# Patient Record
Sex: Female | Born: 1990 | Race: Black or African American | Hispanic: No | Marital: Single | Smoking: Never smoker
Health system: Southern US, Community
[De-identification: ages and names within clinical notes are randomized; demographics above are authoritative.]

## PROBLEM LIST (undated history)

## (undated) DIAGNOSIS — J45909 Unspecified asthma, uncomplicated: Secondary | ICD-10-CM

## (undated) DIAGNOSIS — I10 Essential (primary) hypertension: Secondary | ICD-10-CM

---

## 2017-12-07 DIAGNOSIS — Z91040 Latex allergy status: Secondary | ICD-10-CM | POA: Diagnosis not present

## 2017-12-07 DIAGNOSIS — N760 Acute vaginitis: Secondary | ICD-10-CM | POA: Diagnosis not present

## 2017-12-07 DIAGNOSIS — B9689 Other specified bacterial agents as the cause of diseases classified elsewhere: Secondary | ICD-10-CM | POA: Diagnosis not present

## 2018-01-05 DIAGNOSIS — N760 Acute vaginitis: Secondary | ICD-10-CM | POA: Diagnosis not present

## 2018-01-05 DIAGNOSIS — B9689 Other specified bacterial agents as the cause of diseases classified elsewhere: Secondary | ICD-10-CM | POA: Diagnosis not present

## 2018-01-05 DIAGNOSIS — Z3202 Encounter for pregnancy test, result negative: Secondary | ICD-10-CM | POA: Diagnosis not present

## 2018-01-05 DIAGNOSIS — L739 Follicular disorder, unspecified: Secondary | ICD-10-CM | POA: Diagnosis not present

## 2018-02-01 DIAGNOSIS — F419 Anxiety disorder, unspecified: Secondary | ICD-10-CM | POA: Diagnosis not present

## 2018-02-01 DIAGNOSIS — R072 Precordial pain: Secondary | ICD-10-CM | POA: Diagnosis not present

## 2018-02-01 DIAGNOSIS — R109 Unspecified abdominal pain: Secondary | ICD-10-CM | POA: Diagnosis not present

## 2018-02-01 DIAGNOSIS — R05 Cough: Secondary | ICD-10-CM | POA: Diagnosis not present

## 2018-02-01 DIAGNOSIS — R062 Wheezing: Secondary | ICD-10-CM | POA: Diagnosis not present

## 2018-02-01 DIAGNOSIS — Z3202 Encounter for pregnancy test, result negative: Secondary | ICD-10-CM | POA: Diagnosis not present

## 2018-02-01 DIAGNOSIS — R42 Dizziness and giddiness: Secondary | ICD-10-CM | POA: Diagnosis not present

## 2018-02-01 DIAGNOSIS — R002 Palpitations: Secondary | ICD-10-CM | POA: Diagnosis not present

## 2018-02-01 DIAGNOSIS — R197 Diarrhea, unspecified: Secondary | ICD-10-CM | POA: Diagnosis not present

## 2018-02-01 DIAGNOSIS — R079 Chest pain, unspecified: Secondary | ICD-10-CM | POA: Diagnosis not present

## 2018-02-10 DIAGNOSIS — Y990 Civilian activity done for income or pay: Secondary | ICD-10-CM | POA: Diagnosis not present

## 2018-02-10 DIAGNOSIS — X58XXXA Exposure to other specified factors, initial encounter: Secondary | ICD-10-CM | POA: Diagnosis not present

## 2018-02-10 DIAGNOSIS — Z3202 Encounter for pregnancy test, result negative: Secondary | ICD-10-CM | POA: Diagnosis not present

## 2018-02-10 DIAGNOSIS — N3090 Cystitis, unspecified without hematuria: Secondary | ICD-10-CM | POA: Diagnosis not present

## 2018-02-10 DIAGNOSIS — S39012A Strain of muscle, fascia and tendon of lower back, initial encounter: Secondary | ICD-10-CM | POA: Diagnosis not present

## 2018-02-10 DIAGNOSIS — Y9259 Other trade areas as the place of occurrence of the external cause: Secondary | ICD-10-CM | POA: Diagnosis not present

## 2018-02-10 DIAGNOSIS — M545 Low back pain: Secondary | ICD-10-CM | POA: Diagnosis not present

## 2018-02-24 DIAGNOSIS — Z30430 Encounter for insertion of intrauterine contraceptive device: Secondary | ICD-10-CM | POA: Diagnosis not present

## 2018-03-10 DIAGNOSIS — Z30431 Encounter for routine checking of intrauterine contraceptive device: Secondary | ICD-10-CM | POA: Diagnosis not present

## 2018-04-15 DIAGNOSIS — Z87891 Personal history of nicotine dependence: Secondary | ICD-10-CM | POA: Diagnosis not present

## 2018-04-15 DIAGNOSIS — J069 Acute upper respiratory infection, unspecified: Secondary | ICD-10-CM | POA: Diagnosis not present

## 2018-09-11 DIAGNOSIS — S39012A Strain of muscle, fascia and tendon of lower back, initial encounter: Secondary | ICD-10-CM | POA: Diagnosis not present

## 2018-09-11 DIAGNOSIS — M9903 Segmental and somatic dysfunction of lumbar region: Secondary | ICD-10-CM | POA: Diagnosis not present

## 2018-09-11 DIAGNOSIS — M5414 Radiculopathy, thoracic region: Secondary | ICD-10-CM | POA: Diagnosis not present

## 2018-09-11 DIAGNOSIS — M9902 Segmental and somatic dysfunction of thoracic region: Secondary | ICD-10-CM | POA: Diagnosis not present

## 2018-09-11 DIAGNOSIS — M5386 Other specified dorsopathies, lumbar region: Secondary | ICD-10-CM | POA: Diagnosis not present

## 2018-09-11 DIAGNOSIS — M9905 Segmental and somatic dysfunction of pelvic region: Secondary | ICD-10-CM | POA: Diagnosis not present

## 2018-11-03 DIAGNOSIS — Z1159 Encounter for screening for other viral diseases: Secondary | ICD-10-CM | POA: Diagnosis not present

## 2019-01-20 DIAGNOSIS — Z711 Person with feared health complaint in whom no diagnosis is made: Secondary | ICD-10-CM | POA: Diagnosis not present

## 2019-01-20 DIAGNOSIS — N898 Other specified noninflammatory disorders of vagina: Secondary | ICD-10-CM | POA: Diagnosis not present

## 2019-01-20 DIAGNOSIS — Z30431 Encounter for routine checking of intrauterine contraceptive device: Secondary | ICD-10-CM | POA: Diagnosis not present

## 2019-04-13 ENCOUNTER — Other Ambulatory Visit: Payer: Self-pay

## 2019-04-13 DIAGNOSIS — Z20828 Contact with and (suspected) exposure to other viral communicable diseases: Secondary | ICD-10-CM | POA: Diagnosis not present

## 2019-04-13 DIAGNOSIS — Z20822 Contact with and (suspected) exposure to covid-19: Secondary | ICD-10-CM

## 2019-04-14 ENCOUNTER — Telehealth: Payer: Self-pay | Admitting: *Deleted

## 2019-04-14 LAB — NOVEL CORONAVIRUS, NAA: SARS-CoV-2, NAA: NOT DETECTED

## 2019-04-14 NOTE — Telephone Encounter (Signed)
Patient assisted with my chart .  

## 2019-06-09 ENCOUNTER — Ambulatory Visit: Payer: Medicaid Other | Attending: Internal Medicine

## 2019-06-09 DIAGNOSIS — Z20822 Contact with and (suspected) exposure to covid-19: Secondary | ICD-10-CM

## 2019-06-10 LAB — NOVEL CORONAVIRUS, NAA: SARS-CoV-2, NAA: NOT DETECTED

## 2019-06-15 DIAGNOSIS — N760 Acute vaginitis: Secondary | ICD-10-CM | POA: Diagnosis not present

## 2019-06-15 DIAGNOSIS — Z113 Encounter for screening for infections with a predominantly sexual mode of transmission: Secondary | ICD-10-CM | POA: Diagnosis not present

## 2019-08-03 DIAGNOSIS — Z30432 Encounter for removal of intrauterine contraceptive device: Secondary | ICD-10-CM | POA: Diagnosis not present

## 2019-08-03 DIAGNOSIS — N760 Acute vaginitis: Secondary | ICD-10-CM | POA: Diagnosis not present

## 2019-08-05 ENCOUNTER — Encounter (HOSPITAL_COMMUNITY): Payer: Self-pay | Admitting: Emergency Medicine

## 2019-08-05 ENCOUNTER — Other Ambulatory Visit: Payer: Self-pay

## 2019-08-05 ENCOUNTER — Emergency Department (HOSPITAL_COMMUNITY)
Admission: EM | Admit: 2019-08-05 | Discharge: 2019-08-06 | Disposition: A | Discharge status: Home / Self Care | Payer: Medicaid Other | Attending: Emergency Medicine | Admitting: Emergency Medicine

## 2019-08-05 DIAGNOSIS — Z9889 Other specified postprocedural states: Secondary | ICD-10-CM | POA: Diagnosis not present

## 2019-08-05 DIAGNOSIS — R1030 Lower abdominal pain, unspecified: Secondary | ICD-10-CM | POA: Diagnosis not present

## 2019-08-05 DIAGNOSIS — N939 Abnormal uterine and vaginal bleeding, unspecified: Secondary | ICD-10-CM | POA: Diagnosis not present

## 2019-08-05 DIAGNOSIS — N938 Other specified abnormal uterine and vaginal bleeding: Secondary | ICD-10-CM | POA: Insufficient documentation

## 2019-08-05 DIAGNOSIS — R42 Dizziness and giddiness: Secondary | ICD-10-CM | POA: Insufficient documentation

## 2019-08-05 HISTORY — DX: Unspecified asthma, uncomplicated: J45.909

## 2019-08-05 LAB — CBC
HCT: 38.9 % (ref 36.0–46.0)
Hemoglobin: 12.7 g/dL (ref 12.0–15.0)
MCH: 30.1 pg (ref 26.0–34.0)
MCHC: 32.6 g/dL (ref 30.0–36.0)
MCV: 92.2 fL (ref 80.0–100.0)
Platelets: 323 10*3/uL (ref 150–400)
RBC: 4.22 MIL/uL (ref 3.87–5.11)
RDW: 12.6 % (ref 11.5–15.5)
WBC: 12.6 10*3/uL — ABNORMAL HIGH (ref 4.0–10.5)
nRBC: 0 % (ref 0.0–0.2)

## 2019-08-05 MED ORDER — KETOROLAC TROMETHAMINE 30 MG/ML IJ SOLN
30.0000 mg | Freq: Once | INTRAMUSCULAR | Status: DC
  Filled 2019-08-05: qty 1

## 2019-08-05 MED ORDER — METRONIDAZOLE 500 MG PO TABS
500.0000 mg | ORAL_TABLET | Freq: Once | ORAL | Status: AC
  Administered 2019-08-05: 500 mg via ORAL
  Filled 2019-08-05: qty 1

## 2019-08-05 MED ORDER — SODIUM CHLORIDE 0.9 % IV BOLUS (SEPSIS)
1000.0000 mL | Freq: Once | INTRAVENOUS | Status: AC
  Administered 2019-08-05: 1000 mL via INTRAVENOUS

## 2019-08-05 NOTE — ED Provider Notes (Signed)
TIME SEEN: 11:25 PM  CHIEF COMPLAINT: Vaginal bleeding  HPI: Patient is a 29 year old female who presents to the emergency department with complaints of lower abdominal cramping and vaginal bleeding that started after having her IUD.  States Connie Horton [facility].  [facility]UD removed Wednesday [facility]02/[facility] [place].  Reports she had it removed because she just wanted to be off of hormonal therapy for some time.  Started cramping and having heavy bleeding today.  States she was passing clots which made her concerned.  Sexually active with one female partner.  Uses condoms.  Declines STD testing.  On treatment for BV now.  Last STD screen in Feb. denies vaginal discharge.  LMP - 3/19 - 3/23  Having irregular bleeding with IUD.  Having heavy periods with bad cramps prior to IUD.  States she has been on birth control since she was 29 years old due to very heavy periods with pain.  [facility].  Felt lightheaded and weak.  No fever, N/V/D.  No dysuria.  Feels she is not able to empty her bladder fully.  ROS: See HPI Constitutional: no fever  Eyes: no drainage  ENT: no runny nose   Cardiovascular:  no chest pain  Resp: no SOB  GI: no vomiting GU: no dysuria Integumentary: no rash  Allergy: no hives  Musculoskeletal: no leg swelling  Neurological: no slurred speech ROS otherwise negative  PAST MEDICAL HISTORY/PAST SURGICAL HISTORY:  Past Medical History:  Diagnosis Date  . Asthma     MEDICATIONS:  Prior to Admission medications   Not on File    ALLERGIES:  Allergies  Allergen Reactions  . Citrus Anaphylaxis  . Latex Anaphylaxis    SOCIAL HISTORY:  Social History   Tobacco Use  . Smoking status: Never Smoker  . Smokeless tobacco: Never Used  Substance Use Topics  . Alcohol use: Yes    Comment: occassional    FAMILY HISTORY: No family history  on file.  EXAM: BP (!) 134/91 (BP Location: Left Arm)   Pulse 77   Temp 98.2 F (36.8 C) (Oral)   Resp (!) 22   SpO2 98%  CONSTITUTIONAL: Alert and oriented and responds appropriately to questions. Well-appearing; well-nourished HEAD: Normocephalic EYES: Conjunctivae clear, pupils appear equal, EOM appear intact, no conjunctival pallor ENT: normal nose; moist mucous membranes NECK: Supple, normal ROM CARD: RRR; S1 and S2 appreciated; no murmurs, no clicks, no rubs, no gallops RESP: Normal chest excursion without splinting or tachypnea; breath sounds clear and equal bilaterally; no wheezes, no rhonchi, no rales, no hypoxia or respiratory distress, speaking full sentences ABD/GI: Normal bowel sounds; non-distended; soft, non-tender, no rebound, no guarding, no peritoneal signs, no hepatosplenomegaly GU:  Normal external genitalia. No lesions, rashes noted. Patient has small amount of dark red vaginal bleeding on exam coming from the cervical os.  No sign of hemorrhage.  No clots.  No appreciable vaginal discharge.  No adnexal tenderness, mass or fullness, no cervical motion tenderness. Cervix is not appear friable.  Cervix is closed.  Chaperone present for exam. BACK:  The back appears normal EXT: Normal ROM in all joints; no deformity noted, no edema; no cyanosis SKIN: Normal color for age and race; warm; no rash on exposed skin NEURO: Moves all extremities equally PSYCH: The patient's mood and manner are appropriate.   MEDICAL DECISION MAKING: Patient here with vaginal bleeding after having her  IUD removed.  Discussed with patient that this is to be expected after withdrawal of hormone therapy.  Will check CBC today to evaluate for anemia given reports of heavy bleeding with clots.  She is not on blood thinners.  Will check pregnancy test.  Also reports some urinary urgency and feeling she is unable to empty her bladder fully.  Will check urinalysis.  Hemodynamically stable currently.   Abdominal exam is benign.  Doubt ectopic, TOA, PID, torsion, appendicitis.  She reports significant cramping and lightheadedness.  Will treat with IV fluids and Toradol.  She declines STD screening at this time.  She is currently on Flagyl for BV and is requesting her nighttime dose.  ED PROGRESS: Pelvic exam reassuring.  No sign of hemorrhage at this time.  Patient reports feeling better.  Hemoglobin currently 12.7.  Normal platelet count.  Pregnancy test negative.  Urine shows small amount of blood and trace leukocytes.  I suspect this is from her vaginal bleeding.  No other sign of infection.  Patient continues to be hemodynamically stable.  I have recommended that she [facility]  I do not feel she needs any emergent intervention for her vaginal bleeding at this time.  It was extremely mild currently.  Recommended Tylenol and Motrin for pain.  Discussed return precautions.  At this time, I do not feel there is any life-threatening condition present. I have reviewed, interpreted and discussed all results (EKG, imaging, lab, urine as appropriate) and exam findings with patient/family. I have reviewed nursing notes and appropriate previous records.  I feel the patient is safe to be discharged home without further emergent workup and can continue workup as an outpatient as needed. Discussed usual and customary return precautions. Patient/family verbalize understanding and are comfortable with this plan.  Outpatient follow-up has been provided as needed. All questions have been answered.   Connie Horton was evaluated in Emergency Department on 08/05/2019 for the symptoms described in the history of present illness. She was evaluated in the context of the global COVID-19 pandemic, which necessitated consideration that the patient might be at risk for infection with the SARS-CoV-2 virus that causes COVID-19. Institutional protocols and algorithms that pertain  to the evaluation of patients at risk for COVID-19 are in a state of rapid change based on information released by regulatory bodies including the CDC and federal and state organizations. These policies and algorithms were followed during the patient's care in the ED.     Sonika Levins, Layla Maw, DO 08/06/19 [id number]

## 2019-08-05 NOTE — ED Notes (Signed)
Pt lying in bed. Full monitor on. Pt denies any needs. NAD noted.

## 2019-08-05 NOTE — ED Triage Notes (Signed)
Patient presents after having her IUD removed Wednesday and woke up today with heavy vaginal bleeding and intermittent cramping. Patient endorses nausea and light-headedness. Patient states she is also passing large clots. Patient states she has had to change her pad twice in the last hour. Patient states the bleeding is constant.

## 2019-08-06 LAB — URINALYSIS, ROUTINE W REFLEX MICROSCOPIC
Bacteria, UA: NONE SEEN
Bilirubin Urine: NEGATIVE
Glucose, UA: NEGATIVE mg/dL
Ketones, ur: NEGATIVE mg/dL
Nitrite: NEGATIVE
Protein, ur: 30 mg/dL — AB
Specific Gravity, Urine: 1.027 (ref 1.005–1.030)
pH: 7 (ref 5.0–8.0)

## 2019-08-06 LAB — I-STAT BETA HCG BLOOD, ED (MC, WL, AP ONLY): I-stat hCG, quantitative: <5 m[IU]/mL (ref <5)

## 2019-08-06 NOTE — ED Notes (Signed)
Pt ambulatory to bathroom with no assistance needed

## 2019-08-06 NOTE — ED Notes (Signed)
Pt sitting up on the side of the bed. NAD noted. Pt denies any needs and is ready for d/c

## 2019-08-06 NOTE — Discharge Instructions (Addendum)
Your hemoglobin today was 12.7.  Your pregnancy test was negative.  Your urine showed no sign of infection.  [facility].  Your bleeding on exam has currently decreased and it is normal to start bleeding after the removal of an IUD.  Given your history of very heavy and painful periods in the past, you may want to discuss with your OB/GYN going back on hormone therapy/birth control to help control this.  You may alternate Tylenol 1000 mg every 6 hours as needed for pain and ibuprofen 800 mg every 8 hours as needed for pain.  [facility]:  [facility] [employer] [facility] [employer] [address] [place], [place] [phone]  [facility] for [facility] [address] [place], [place] [phone]  [facility] [address] [place]  [address] [place], [place] [phone]   [facility] [address] [place] [address] [place], [place] [phone]  [facility] [address] [place], [place] [phone]   [facility] [address] [place] [address] [place], [place] [phone]   [facility] [address] [place], [place] [phone]   [facility] [address] [place], [place] [phone]   [facility] [address] [place], [place] [phone]

## 2019-08-15 ENCOUNTER — Ambulatory Visit: Payer: Medicaid Other | Attending: Internal Medicine

## 2019-08-15 DIAGNOSIS — Z20822 Contact with and (suspected) exposure to covid-19: Secondary | ICD-10-CM | POA: Diagnosis not present

## 2019-08-16 LAB — SARS-COV-2, NAA 2 DAY TAT

## 2019-08-16 LAB — NOVEL CORONAVIRUS, NAA: SARS-CoV-2, NAA: NOT DETECTED

## 2019-08-19 ENCOUNTER — Ambulatory Visit (HOSPITAL_COMMUNITY)
Admission: EM | Admit: 2019-08-19 | Discharge: 2019-08-19 | Disposition: A | Discharge status: Home / Self Care | Payer: Medicaid Other | Attending: Family Medicine | Admitting: Family Medicine

## 2019-08-19 ENCOUNTER — Other Ambulatory Visit: Payer: Self-pay

## 2019-08-19 ENCOUNTER — Encounter (HOSPITAL_COMMUNITY): Payer: Self-pay

## 2019-08-19 DIAGNOSIS — Z20822 Contact with and (suspected) exposure to covid-19: Secondary | ICD-10-CM | POA: Diagnosis not present

## 2019-08-19 DIAGNOSIS — R197 Diarrhea, unspecified: Secondary | ICD-10-CM

## 2019-08-19 LAB — POCT RAPID STREP A: Streptococcus, Group A Screen (Direct): NEGATIVE

## 2019-08-19 NOTE — Discharge Instructions (Addendum)
Go home to rest Drink plenty of fluids Take Tylenol for pain or fever/  Read diarrhea instructions. You may take over-the-counter cough and cold medicines as needed You must quarantine at home until your test result is available You can check for your test result in MyChart CALL for questions

## 2019-08-19 NOTE — ED Triage Notes (Signed)
Pt states she was tested for Covid last Saturday. Pt states the results read negative.  Pt states she has diarrhea. And her job wants her retested. Pt states she needs a work note .

## 2019-08-19 NOTE — ED Provider Notes (Signed)
Connie Horton    CSN: [id number] Arrival date & time: 08/19/19  1253      History   Chief Complaint Chief Complaint  Patient presents with  . Diarrhea    HPI Connie Horton is a 29 y.o. female.   HPI  Patient was tested for Covid a week ago.  It was negative She states that she continues to feel bad.  Fatigue.  Body aches.  Now she has developed diarrhea. Since her last Covid test she has been exposed to Covid.  She states that she took her car to her mechanic who was coughing and was sick.  She later found out that he had Covid. She needs a note for work No shortness of breath.  No fever or chills.  No loss of taste or smell.   Past Medical History:  Diagnosis Date  . Asthma     There are no problems to display for this patient.   History reviewed. No pertinent surgical history.  OB History   No obstetric history on file.      Home Medications    Prior to Admission medications   Medication Sig Start Date End Date Taking? Authorizing Provider  albuterol (VENTOLIN HFA) 108 (90 Base) MCG/ACT inhaler Inhale 2 puffs into the lungs every 6 (six) hours as needed for wheezing or shortness of breath.    [provider]  diphenhydrAMINE (BENADRYL) 25 MG tablet Take 25 mg by mouth every 6 (six) hours as needed for itching or allergies.    [provider]  ibuprofen (ADVIL) 200 MG tablet Take 400 mg by mouth every 6 (six) hours as needed for moderate pain.    [provider]  metroNIDAZOLE (FLAGYL) 500 MG tablet Take 500 mg by mouth 2 (two) times daily.  08/03/19   [provider]  sodium chloride (OCEAN) 0.65 % SOLN nasal spray Place 1 spray into both nostrils as needed for congestion.    [provider]    Family History History reviewed. No pertinent family history.  Social History Social History   Tobacco Use  . Smoking status: Never Smoker  . Smokeless tobacco: Never Used  Substance Use Topics  . Alcohol  use: Yes    Comment: occassional  . Drug use: Not Currently     Allergies   Citrus and Latex   Review of Systems Review of Systems  Constitutional: Positive for fatigue.  HENT: Positive for sore throat.   Gastrointestinal: Positive for diarrhea.     Physical Exam Triage Vital Signs ED Triage Vitals  Enc Vitals Group     BP 08/19/19 1342 137/90     Pulse Rate 08/19/19 1342 89     Resp 08/19/19 1342 16     Temp 08/19/19 1342 98.2 F (36.8 C)     Temp Source 08/19/19 1342 Oral     SpO2 08/19/19 1342 100 %     Weight 08/19/19 1344 172 lb 9.6 oz (78.3 kg)     Height --      Head Circumference --      Peak Flow --      Pain Score 08/19/19 1344 5     Pain Loc --      Pain Edu? --      Excl. in [place]? --    No data found.  Updated Vital Signs BP 137/90 (BP Location: Right Arm)   Pulse 89   Temp 98.2 F (36.8 C) (Oral)   Resp 16  Wt 78.3 kg   LMP 08/19/2019   SpO2 100%      Physical Exam Constitutional:      General: She is not in acute distress.    Appearance: She is well-developed.  HENT:     Head: Normocephalic and atraumatic.     Right Ear: Tympanic membrane and ear canal normal.     Left Ear: Tympanic membrane and ear canal normal.     Nose: Nose normal. No congestion.     Mouth/Throat:     Pharynx: Posterior oropharyngeal erythema present.     Comments: Posterior pharynx injected.  No exudate.  Rapid strep is negative Eyes:     Conjunctiva/sclera: Conjunctivae normal.     Pupils: Pupils are equal, round, and reactive to light.  Cardiovascular:     Rate and Rhythm: Normal rate and regular rhythm.     Heart sounds: Normal heart sounds.  Pulmonary:     Effort: Pulmonary effort is normal. No respiratory distress.     Breath sounds: Normal breath sounds.  Musculoskeletal:        General: Normal range of motion.     Cervical back: Normal range of motion.  Lymphadenopathy:     Cervical: Cervical adenopathy present.  Skin:    General: Skin is warm and  dry.  Neurological:     Mental Status: She is alert.  Psychiatric:        Mood and Affect: Mood normal.        Behavior: Behavior normal.      UC Treatments / Results  Labs (all labs ordered are listed, but only abnormal results are displayed) Labs Reviewed  CULTURE, GROUP A STREP [facility])  POCT RAPID STREP A  Rapid strep is negative Throat culture is pending  EKG   Radiology No results found.  Procedures Procedures (including critical care time)  Medications Ordered in UC Medications - No data to display  Initial Impression / Assessment and Plan / UC Course  I have reviewed the triage vital signs and the nursing notes.  Pertinent labs & imaging results that were available during my care of the patient were reviewed by me and considered in my medical decision making (see chart for details).     Note for work.  Repeat coronavirus testing.  Emphasized the need to quarantine until test results are available Final Clinical Impressions(s) / UC Diagnoses   Final diagnoses:  Diarrhea, unspecified type  Contact with and (suspected) exposure to covid-19     Discharge Instructions     Go home to rest Drink plenty of fluids Take Tylenol for pain or fever/  Read diarrhea instructions. You may take over-the-counter cough and cold medicines as needed You must quarantine at home until your test result is available You can check for your test result in MyChart CALL for questions   ED Prescriptions    None     PDMP not reviewed this encounter.   Eustace Moore, MD 08/19/19 2110

## 2019-08-20 ENCOUNTER — Telehealth (HOSPITAL_COMMUNITY): Payer: Self-pay

## 2019-08-20 LAB — SARS CORONAVIRUS 2 (TAT 6-24 HRS): SARS Coronavirus 2: NEGATIVE

## 2019-08-21 LAB — CULTURE, GROUP A STREP (THRC)

## 2019-08-26 ENCOUNTER — Telehealth (HOSPITAL_COMMUNITY): Payer: Self-pay

## 2019-08-26 NOTE — Telephone Encounter (Signed)
Patient calling requesting a work note. Needs work note to say she was exposed to COVID. Reports she is still experiencing sore throat, headache, and diarrhea. Her job is making her quarantine until she is no longer symptomatic.   Informed patient that since she was seen almost a week ago, we would really need her to come in an be re-evaluated. Also educated her about increased risk for false negatives in the first five days of contracting COVID. Patient agreeable to come back in and be re-evaluated by a medical professional. Patient reports she will come either today or tomorrow.

## 2019-08-29 ENCOUNTER — Ambulatory Visit (HOSPITAL_COMMUNITY)
Admission: EM | Admit: 2019-08-29 | Discharge: 2019-08-29 | Disposition: A | Discharge status: Home / Self Care | Payer: Medicaid Other | Attending: Family Medicine | Admitting: Family Medicine

## 2019-08-29 ENCOUNTER — Other Ambulatory Visit: Payer: Self-pay

## 2019-08-29 DIAGNOSIS — B349 Viral infection, unspecified: Secondary | ICD-10-CM | POA: Diagnosis not present

## 2019-08-29 DIAGNOSIS — J029 Acute pharyngitis, unspecified: Secondary | ICD-10-CM

## 2019-08-29 DIAGNOSIS — J45909 Unspecified asthma, uncomplicated: Secondary | ICD-10-CM | POA: Insufficient documentation

## 2019-08-29 DIAGNOSIS — R5383 Other fatigue: Secondary | ICD-10-CM

## 2019-08-29 DIAGNOSIS — Z91040 Latex allergy status: Secondary | ICD-10-CM | POA: Insufficient documentation

## 2019-08-29 DIAGNOSIS — Z20822 Contact with and (suspected) exposure to covid-19: Secondary | ICD-10-CM | POA: Diagnosis not present

## 2019-08-29 DIAGNOSIS — R52 Pain, unspecified: Secondary | ICD-10-CM | POA: Diagnosis present

## 2019-08-29 LAB — POCT RAPID STREP A: Streptococcus, Group A Screen (Direct): NEGATIVE

## 2019-08-29 MED ORDER — IBUPROFEN 800 MG PO TABS: 800.0000 mg | ORAL_TABLET | Freq: Three times a day (TID) | ORAL | 0 refills | Status: DC

## 2019-08-29 MED ORDER — FLUTICASONE PROPIONATE 50 MCG/ACT NA SUSP: 1.0000 | Freq: Every day | NASAL | 0 refills | Status: DC

## 2019-08-29 MED ORDER — CETIRIZINE HCL 10 MG PO CAPS: 10.0000 mg | ORAL_CAPSULE | Freq: Every day | ORAL | 0 refills | Status: DC

## 2019-08-29 NOTE — ED Triage Notes (Signed)
Pt tested negative for COVID 4/16. C/o continued body aches, fatigue, headache an diarrhea.

## 2019-08-29 NOTE — Discharge Instructions (Signed)
Strep test negative, repeat Covid test pending Tylenol and ibuprofen for body aches, headaches, sore throat Begin daily Flonase nasal spray to help with sinus congestion/inflammation Begin daily cetirizine to help with any postnasal drainage contributing to sore throat Rest and drink plenty of fluids  Please follow-up if any symptoms not improving or worsening

## 2019-08-29 NOTE — ED Provider Notes (Signed)
[facility]    CSN: [id number] Arrival date & time: 08/29/19  1321      History   Chief Complaint Chief Complaint  Patient presents with  . Generalized Body Aches    HPI Connie Horton is a 29 y.o. female history of asthma presenting today for evaluation of fatigue, sore throat and headache.  Patient notes that over the past couple weeks she has had intermittent symptoms off and on.  She has had persistent fatigue.  She initially had diarrhea, but this is improved, bowels have returned to normal.  She continues to have a sore throat which worsens at nighttime and in the morning.  She reports feeling congested and having difficulty breathing at nighttime, but no significant rhinorrhea.  Has had multiple Covid exposures of recently, has been seen here previously with negative Covid testing.  Reports that her boyfriend who she was around this weekend also recently tested positive for Covid.  Her symptoms started before, have not changed since.  HPI  Past Medical History:  Diagnosis Date  . Asthma     There are no problems to display for this patient.   No past surgical history on file.  OB History   No obstetric history on file.      Home Medications    Prior to Admission medications   Medication Sig Start Date End Date Taking? Authorizing Provider  albuterol (VENTOLIN HFA) 108 (90 Base) MCG/ACT inhaler Inhale 2 puffs into the lungs every 6 (six) hours as needed for wheezing or shortness of breath.    [provider]  Cetirizine HCl 10 MG CAPS Take 1 capsule (10 mg total) by mouth daily. 08/29/19   Devyn Griffing C, PA-C  diphenhydrAMINE (BENADRYL) 25 MG tablet Take 25 mg by mouth every 6 (six) hours as needed for itching or allergies.    [provider]  fluticasone (FLONASE) 50 MCG/ACT nasal spray Place 1-2 sprays into both nostrils daily for 7 days. 08/29/19 09/05/19  Marshal Eskew C, PA-C  ibuprofen (ADVIL) 800 MG tablet Take 1 tablet (800 mg  total) by mouth 3 (three) times daily. 08/29/19   Latanga Nedrow C, PA-C  metroNIDAZOLE (FLAGYL) 500 MG tablet Take 500 mg by mouth 2 (two) times daily.  08/03/19   [provider]  sodium chloride (OCEAN) 0.65 % SOLN nasal spray Place 1 spray into both nostrils as needed for congestion.    [provider]    Family History No family history on file.  Social History Social History   Tobacco Use  . Smoking status: Never Smoker  . Smokeless tobacco: Never Used  Substance Use Topics  . Alcohol use: Yes    Comment: occassional  . Drug use: Not Currently     Allergies   Citrus and Latex   Review of Systems Review of Systems  Constitutional: Positive for appetite change and fatigue. Negative for activity change, chills and fever.  HENT: Positive for congestion and sore throat. Negative for ear pain, rhinorrhea, sinus pressure and trouble swallowing.   Eyes: Negative for discharge and redness.  Respiratory: Negative for cough, chest tightness and shortness of breath.   Cardiovascular: Negative for chest pain.  Gastrointestinal: Negative for abdominal pain, diarrhea, nausea and vomiting.  Musculoskeletal: Positive for myalgias.  Skin: Negative for rash.  Neurological: Positive for headaches. Negative for dizziness and light-headedness.     Physical Exam Triage Vital Signs ED Triage Vitals  Enc Vitals Group     BP 08/29/19 1356 [facility])  133/95     Pulse Rate 08/29/19 1356 96     Resp 08/29/19 1356 16     Temp 08/29/19 1356 98.6 F (37 C)     Temp Source 08/29/19 1356 Oral     SpO2 08/29/19 1356 100 %     Weight --      Height --      Head Circumference --      Peak Flow --      Pain Score 08/29/19 1509 0     Pain Loc --      Pain Edu? --      Excl. in [place]? --    No data found.  Updated Vital Signs BP (!) 133/95   Pulse 96   Temp 98.6 F (37 C) (Oral)   Resp 16   LMP 08/19/2019   SpO2 100%   Visual Acuity Right Eye Distance:   Left Eye  Distance:   Bilateral Distance:    Right Eye Near:   Left Eye Near:    Bilateral Near:     Physical Exam Vitals and nursing note reviewed.  Constitutional:      General: She is not in acute distress.    Appearance: She is well-developed.  HENT:     Head: Normocephalic and atraumatic.     Ears:     Comments: Bilateral ears without tenderness to palpation of external auricle, tragus and mastoid, EAC's without erythema or swelling, TM's with good bony landmarks and cone of light. Non erythematous.     Mouth/Throat:     Comments: Oral mucosa pink and moist, no tonsillar enlargement or exudate. Posterior pharynx patent and nonerythematous, no uvula deviation or swelling. Normal phonation.  Eyes:     Conjunctiva/sclera: Conjunctivae normal.  Cardiovascular:     Rate and Rhythm: Normal rate and regular rhythm.     Heart sounds: No murmur.  Pulmonary:     Effort: Pulmonary effort is normal. No respiratory distress.     Breath sounds: Normal breath sounds.     Comments: Breathing comfortably at rest, CTABL, no wheezing, rales or other adventitious sounds auscultated Abdominal:     Palpations: Abdomen is soft.     Tenderness: There is no abdominal tenderness.     Comments: Soft, nondistended, nontender to light and deep palpation throughout abdomen  Musculoskeletal:     Cervical back: Neck supple.  Skin:    General: Skin is warm and dry.  Neurological:     Mental Status: She is alert.      UC Treatments / Results  Labs (all labs ordered are listed, but only abnormal results are displayed) Labs Reviewed  SARS CORONAVIRUS 2 (TAT 6-24 HRS)  CULTURE, GROUP A STREP [facility])  POCT RAPID STREP A    EKG   Radiology No results found.  Procedures Procedures (including critical care time)  Medications Ordered in UC Medications - No data to display  Initial Impression / Assessment and Plan / UC Course  I have reviewed the triage vital signs and the nursing notes.  Pertinent  labs & imaging results that were available during my care of the patient were reviewed by me and considered in my medical decision making (see chart for details).     Repeat strep negative, Covid PCR pending given new exposure.  Possible false negative.  Recommending continued symptomatic and supportive care.  No sign of specific infection on exam, lungs clear.  Rest and push fluids.  Provided work note for extended quarantine.  Discussed strict return precautions. Patient verbalized understanding and is agreeable with plan.  Final Clinical Impressions(s) / UC Diagnoses   Final diagnoses:  Viral illness  Fatigue, unspecified type  Sore throat     Discharge Instructions     Strep test negative, repeat Covid test pending Tylenol and ibuprofen for body aches, headaches, sore throat Begin daily Flonase nasal spray to help with sinus congestion/inflammation Begin daily cetirizine to help with any postnasal drainage contributing to sore throat Rest and drink plenty of fluids  Please follow-up if any symptoms not improving or worsening   ED Prescriptions    Medication Sig Dispense Auth. Provider   ibuprofen (ADVIL) 800 MG tablet Take 1 tablet (800 mg total) by mouth 3 (three) times daily. 21 tablet Owynn Mosqueda C, PA-C   fluticasone (FLONASE) 50 MCG/ACT nasal spray Place 1-2 sprays into both nostrils daily for 7 days. 1 g Shimika Ames C, PA-C   Cetirizine HCl 10 MG CAPS Take 1 capsule (10 mg total) by mouth daily. 15 capsule Coren Sagan, [place] C, PA-C     PDMP not reviewed this encounter.   Lew Dawes, [place] 08/29/19 1607

## 2019-08-30 LAB — SARS CORONAVIRUS 2 (TAT 6-24 HRS): SARS Coronavirus 2: NEGATIVE

## 2019-08-31 LAB — CULTURE, GROUP A STREP (THRC)

## 2019-10-10 ENCOUNTER — Ambulatory Visit: Payer: Medicaid Other | Admitting: Family Medicine

## 2020-02-14 ENCOUNTER — Emergency Department (HOSPITAL_COMMUNITY): Payer: Medicaid Other

## 2020-02-14 ENCOUNTER — Encounter (HOSPITAL_COMMUNITY): Payer: Self-pay

## 2020-02-14 ENCOUNTER — Emergency Department (HOSPITAL_COMMUNITY)
Admission: EM | Admit: 2020-02-14 | Discharge: 2020-02-14 | Disposition: A | Discharge status: Home / Self Care | Payer: Medicaid Other | Attending: Emergency Medicine | Admitting: Emergency Medicine

## 2020-02-14 DIAGNOSIS — M25511 Pain in right shoulder: Secondary | ICD-10-CM | POA: Insufficient documentation

## 2020-02-14 DIAGNOSIS — M542 Cervicalgia: Secondary | ICD-10-CM | POA: Diagnosis not present

## 2020-02-14 DIAGNOSIS — J45909 Unspecified asthma, uncomplicated: Secondary | ICD-10-CM | POA: Insufficient documentation

## 2020-02-14 DIAGNOSIS — Z79899 Other long term (current) drug therapy: Secondary | ICD-10-CM | POA: Diagnosis not present
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
## 2020-02-14 NOTE — ED Notes (Signed)
An After Visit Summary was printed and given to the patient. Discharge instructions given and no further questions at this time.  

## 2020-02-14 NOTE — Discharge Instructions (Addendum)
Seen for a shoulder injury.  Imaging looks reassuring.  I placed you in a sling,  please use for 1 week.  Do not sleep with it on.  Use only during vigorous activities.  I recommend taking over-the-counter pain medications like ibuprofen and or Tylenol every 6 hours as needed please follow dosing the back of bottle.  I also recommend applying heat to the area and stretching the muscles as this will help decrease stiffness and pain.  It is important that you follow-up with Ortho for further evaluation management.  I have given you contact information for Dr. Ophelia Charter please call today  Come back to emergency department if you develop pins and needle sensation in your arm, chest pain, shortness of breath, severe abdominal pain, uncontrolled nausea, vomiting and diarrhea

## 2020-02-14 NOTE — ED Provider Notes (Signed)
[facility]-EMERGENCY DEPT Provider Note   CSN: [id number] Arrival date & time: 02/14/20  0962     History Chief Complaint  Patient presents with  . Shoulder Pain    Connie Horton is a 29 y.o. female.  HPI   Patient with no significant medical history presents to the emergency department with chief complaint of right shoulder pain.  Patient states she hurt her shoulder 2 days ago when she was "play wrestling" with her boyfriend.  She states she fell off the bed landing directly on her right shoulder.  She states since then she has had increasing shoulder and right neck pain.  She claims the pain is a constant throbbing sensation which is worse  when she moves her shoulder.  She denies numbness or tingling in her fingers or wrist and has been taking ibuprofen with some relief.  She has not seen in orthopedics for this injury.  She denies hitting her head, losing conscious, being on anticoagulant.  She states her home is safe.  Patient denies headache, fever, chills, shortness of breath, chest pain, dumping, nausea, vomiting, diarrhea, pedal edema.  Past Medical History:  Diagnosis Date  . Asthma     There are no problems to display for this patient.   History reviewed. No pertinent surgical history.   OB History   No obstetric history on file.     History reviewed. No pertinent family history.  Social History   Tobacco Use  . Smoking status: Never Smoker  . Smokeless tobacco: Never Used  Vaping Use  . Vaping Use: Every day  Substance Use Topics  . Alcohol use: Yes    Comment: occassional  . Drug use: Not Currently    Home Medications Prior to Admission medications   Medication Sig Start Date End Date Taking? Authorizing Provider  albuterol (VENTOLIN HFA) 108 (90 Base) MCG/ACT inhaler Inhale 2 puffs into the lungs every 6 (six) hours as needed for wheezing or shortness of breath.    [provider]  Cetirizine HCl 10 MG CAPS Take 1  capsule (10 mg total) by mouth daily. 08/29/19   Wieters, Hallie C, PA-C  diphenhydrAMINE (BENADRYL) 25 MG tablet Take 25 mg by mouth every 6 (six) hours as needed for itching or allergies.    [provider]  fluticasone (FLONASE) 50 MCG/ACT nasal spray Place 1-2 sprays into both nostrils daily for 7 days. 08/29/19 09/05/19  Wieters, Hallie C, PA-C  ibuprofen (ADVIL) 800 MG tablet Take 1 tablet (800 mg total) by mouth 3 (three) times daily. 08/29/19   Wieters, Hallie C, PA-C  metroNIDAZOLE (FLAGYL) 500 MG tablet Take 500 mg by mouth 2 (two) times daily.  08/03/19   [provider]  sodium chloride (OCEAN) 0.65 % SOLN nasal spray Place 1 spray into both nostrils as needed for congestion.    [provider]    Allergies    Citrus and Latex  Review of Systems   Review of Systems  Constitutional: Negative for chills and fever.  HENT: Negative for congestion.   Respiratory: Negative for shortness of breath.   Cardiovascular: Negative for chest pain.  Gastrointestinal: Negative for abdominal pain, diarrhea, nausea and vomiting.  Genitourinary: Negative for dysuria, enuresis, flank pain and frequency.  Musculoskeletal: Negative for back pain.       Patient complains of right shoulder pain.  Skin: Negative for rash.  Neurological: Negative for dizziness and headaches.  Hematological: Does not bruise/bleed easily.    Physical Exam  Updated Vital Signs BP 129/74   Pulse 70   Temp 98.1 F (36.7 C) (Oral)   Resp 18   LMP 01/15/2020 (Approximate)   SpO2 99%   Physical Exam Vitals and nursing note reviewed.  Constitutional:      General: She is not in acute distress.    Appearance: Normal appearance. She is not ill-appearing or diaphoretic.  HENT:     Head: Normocephalic and atraumatic.     Nose: No congestion or rhinorrhea.  Eyes:     General: No scleral icterus.       Right eye: No discharge.        Left eye: No discharge.     Conjunctiva/sclera: Conjunctivae  normal.  Pulmonary:     Effort: Pulmonary effort is normal. No respiratory distress.     Breath sounds: Normal breath sounds. No wheezing.  Musculoskeletal:        General: Tenderness present. No swelling or deformity.     Cervical back: Neck supple.     Right lower leg: No edema.     Left lower leg: No edema.     Comments: Patient's right shoulder is visualized, no gross abnormalities noted, she had full range of motion as well as 5 of 5 strength noted at her fingers, wrist, elbow.  She had noted decreased range of motion at the shoulder difficulty with flexion, abduction and abduction, flexion.  Neurovascular fully intact.  Skin:    General: Skin is warm and dry.     Coloration: Skin is not jaundiced or pale.  Neurological:     Mental Status: She is alert and oriented to person, place, and time.  Psychiatric:        Mood and Affect: Mood normal.     ED Results / Procedures / Treatments   Labs (all labs ordered are listed, but only abnormal results are displayed) Labs Reviewed - No data to display  EKG None  Radiology DG Shoulder Right  Result Date: 02/14/2020 CLINICAL DATA:  Right shoulder pain x 2 days s/p falling from bed onto arm. EXAM: RIGHT SHOULDER - 2+ VIEW COMPARISON:  None. FINDINGS: There is no evidence of fracture or dislocation. There is no evidence of arthropathy or other focal bone abnormality. Soft tissues are unremarkable. IMPRESSION: Negative. Electronically Signed   By: Emmaline Kluver M.D.   On: 02/14/2020 08:43    Procedures Procedures (including critical care time)  Medications Ordered in ED Medications - No data to display  ED Course  I have reviewed the triage vital signs and the nursing notes.  Pertinent labs & imaging results that were available during my care of the patient were reviewed by me and considered in my medical decision making (see chart for details).    MDM Rules/Calculators/A&P                          I have personally  reviewed all imaging, labs and have interpreted them.  Patient presents with right shoulder pain.  She was alert, did not appear in acute distress, vital signs reassuring.  Will order x-ray of right shoulder further evaluation.  Right shoulder x-ray did not reveal any acute abnormalities.  I have low suspicion for compartment syndrome as extremity was soft to the touch, neurovascular fully intact.  I have low suspicion for septic arthritis as patient denies IV drug use, on exam joint was nonerythematous, not warm to the touch.  Low suspicion for fracture, dislocation  as x-ray did not reveal any acute abnormalities, on exam no deformities noted, no crepitus felt.  Low suspicion for ligament or tendon damage as patient had full range of motion at the fingers, wrist, elbow with limited range of motion at the shoulder, no deformities palpated.  I suspect this may be limited due to pain but cannot fully exclude ligament damage.  Will place patient in a sling and have her follow-up with Ortho for further evaluation.  Patient vital signs remained stable, no indication for hospital admission.  Patient given at home care and strict return precautions.  Patient relates he understood and agreed to the plan. Final Clinical Impression(s) / ED Diagnoses Final diagnoses:  Acute pain of right shoulder    Rx / DC Orders ED Discharge Orders    None       Carroll Sage, PA-C 02/14/20 1553    Donnetta Hutching, MD 02/15/20 [phone]

## 2020-02-15 ENCOUNTER — Encounter: Payer: Self-pay | Admitting: Surgery

## 2020-02-15 ENCOUNTER — Ambulatory Visit: Payer: Self-pay

## 2020-02-15 ENCOUNTER — Ambulatory Visit (INDEPENDENT_AMBULATORY_CARE_PROVIDER_SITE_OTHER): Payer: Medicaid Other | Admitting: Surgery

## 2020-02-15 VITALS — BP 110/76 | HR 79

## 2020-02-15 DIAGNOSIS — M542 Cervicalgia: Secondary | ICD-10-CM | POA: Diagnosis not present

## 2020-02-15 DIAGNOSIS — S40011A Contusion of right shoulder, initial encounter: Secondary | ICD-10-CM | POA: Diagnosis not present

## 2020-02-15 DIAGNOSIS — S161XXA Strain of muscle, fascia and tendon at neck level, initial encounter: Secondary | ICD-10-CM | POA: Diagnosis not present

## 2020-02-15 MED ORDER — METHOCARBAMOL 500 MG PO TABS: 500.0000 mg | ORAL_TABLET | Freq: Three times a day (TID) | ORAL | 0 refills | Status: DC | PRN

## 2020-02-15 NOTE — Progress Notes (Signed)
Office Visit Note   Patient: Connie Horton           Date of Birth: 05/31/1990           MRN: [id number] Visit Date: 02/15/2020              Requested by: No referring provider defined for this encounter. PCP: Patient, No Pcp Per   Assessment & Plan: Visit Diagnoses:  1. Neck pain   2. Contusion of right shoulder, initial encounter   3. Strain of neck muscle, initial encounter     Plan: At this point will attempt conservative treatment.  Advised patient that this is likely a strain of the shoulder.  I will have her wear a sling and follow-up in 1 week with me for recheck.  We will also keep her out of work until she follows back up in the clinic.  Prescriptions for Robaxin for spasms and Ultram for pain as needed.  Follow-Up Instructions: Return for follow up with one week with Rayland Hamed recheck neck and right shoulder.   Orders:  Orders Placed This Encounter  Procedures  . XR Cervical Spine 2 or 3 views   Meds ordered this encounter  Medications  . methocarbamol (ROBAXIN) 500 MG tablet    Sig: Take 1 tablet (500 mg total) by mouth every 8 (eight) hours as needed for muscle spasms.    Dispense:  30 tablet    Refill:  0      Procedures: No procedures performed   Clinical Data: No additional findings.   Subjective: Chief Complaint  Patient presents with  . Right Shoulder - Pain    HPI 29 year old white female is new patient to clinic comes in today with complaints of right shoulder pain.  States that 2 days ago she was play fighting with her boyfriend when she fell off the bed and landed onto her right shoulder.  She had immediate pain after.  No feeling of instability.  Currently has pain with overhead reaching and internal rotation behind her back.  She describes some soreness in the right side of her neck and intermittent symptoms of burning down her arm.  She has been taking Tylenol and ibuprofen as needed.  Patient did go to the emergency room February 14, 2020 and  had x-rays right shoulder which were negative for fracture. Review of Systems No current cardiac pulmonary GI GU issues  Objective: Vital Signs: BP 110/76   Pulse 79   Physical Exam HENT:     Head: Normocephalic.     Nose: Nose normal.  Neck:     Comments: Cervical spine good range of motion.  She has mild tenderness of the right trapezius muscle.  No brachial plexus tenderness. Pulmonary:     Effort: No respiratory distress.  Musculoskeletal:     Comments: Exam right shoulder she has some diffuse tenderness.  Active range of motion with discomfort at about 120 degrees flexion/abduction.  Passively I can get her to full.  Negative drop arm test.  Neurologically intact.  Neurological:     General: No focal deficit present.     Mental Status: She is alert and oriented to person, place, and time.     Ortho Exam  Specialty Comments:  No specialty comments available.  Imaging: No results found.   PMFS History: There are no problems to display for this patient.  Past Medical History:  Diagnosis Date  . Asthma     History reviewed. No pertinent family history.  History reviewed. No pertinent surgical history. Social History   Occupational History  . Not on file  Tobacco Use  . Smoking status: Never Smoker  . Smokeless tobacco: Never Used  Vaping Use  . Vaping Use: Every day  Substance and Sexual Activity  . Alcohol use: Yes    Comment: occassional  . Drug use: Not Currently  . Sexual activity: Yes

## 2020-02-22 ENCOUNTER — Ambulatory Visit (INDEPENDENT_AMBULATORY_CARE_PROVIDER_SITE_OTHER): Payer: Medicaid Other | Admitting: Surgery

## 2020-02-22 DIAGNOSIS — S161XXA Strain of muscle, fascia and tendon at neck level, initial encounter: Secondary | ICD-10-CM | POA: Diagnosis not present

## 2020-02-22 DIAGNOSIS — S40011A Contusion of right shoulder, initial encounter: Secondary | ICD-10-CM | POA: Diagnosis not present

## 2020-02-22 MED ORDER — METHYLPREDNISOLONE 4 MG PO TABS: ORAL_TABLET | ORAL | 0 refills | Status: DC

## 2020-02-22 NOTE — Progress Notes (Signed)
   Office Visit Note   Patient: Connie Horton           Date of Birth: 09/24/1990           MRN: [id number] Visit Date: 02/22/2020              Requested by: No referring provider defined for this encounter. PCP: Patient, No Pcp Per   Assessment & Plan: Visit Diagnoses:  1. Contusion of right shoulder, initial encounter   2. Strain of neck muscle, initial encounter     Plan: Advised patient that I expect that she will continue to improve.  We will keep her out of work x1 more week and return for reevaluation at that time.  I sent in a prescription for Medrol Dosepak 6-day taper to be taken as directed.  She will discontinue any other oral NSAID while on this medication.  I also encouraged her to pick up the Robaxin prescription that I sent last visit.  Follow-Up Instructions: Return in about 1 week (around 02/29/2020).   Orders:  No orders of the defined types were placed in this encounter.  Meds ordered this encounter  Medications  . methylPREDNISolone (MEDROL) 4 MG tablet    Sig: 6-day taper to be taken as directed.  Discontinue all other oral NSAIDs while on this medication.    Dispense:  21 tablet    Refill:  0      Procedures: No procedures performed   Clinical Data: No additional findings.   Subjective: Chief Complaint  Patient presents with  . Right Shoulder - Follow-up    HPI 29 year old black female returns for recheck of her cervical strain and right shoulder contusion.  States that overall she is improving but still continues have some pain right side of her neck.  Also some shoulder pain when she raises her arm but she definitely has better motion of the shoulder.  Patient works with [employer] and her job requires her to do a lot of lifting, pushing, pulling and she does not feel that she is ready to return at this time.  No cervical radicular component. Review of Systems No current cardiac pulmonary GI GU issues  Objective: Vital Signs: There were no  vitals taken for this visit.  Physical Exam Pleasant female alert and oriented in no acute distress.  Cervical spine range of motion much improved.  No brachial plexus tenderness.  Some soreness lateral right neck.  Right shoulder she has full flexion/abduction.  Some discomfort with this maneuver but also definitely improved from last office visit.  Negative drop arm test.  Good biceps triceps wrist flexion and extension strength.  Normal grip strength. Ortho Exam  Specialty Comments:  No specialty comments available.  Imaging: No results found.   PMFS History: There are no problems to display for this patient.  Past Medical History:  Diagnosis Date  . Asthma     No family history on file.  No past surgical history on file. Social History   Occupational History  . Not on file  Tobacco Use  . Smoking status: Never Smoker  . Smokeless tobacco: Never Used  Vaping Use  . Vaping Use: Every day  Substance and Sexual Activity  . Alcohol use: Yes    Comment: occassional  . Drug use: Not Currently  . Sexual activity: Yes

## 2020-03-01 ENCOUNTER — Encounter: Payer: Self-pay | Admitting: Surgery

## 2020-03-01 ENCOUNTER — Ambulatory Visit (INDEPENDENT_AMBULATORY_CARE_PROVIDER_SITE_OTHER): Payer: Medicaid Other | Admitting: Surgery

## 2020-03-01 DIAGNOSIS — S40011A Contusion of right shoulder, initial encounter: Secondary | ICD-10-CM

## 2020-03-01 DIAGNOSIS — M7541 Impingement syndrome of right shoulder: Secondary | ICD-10-CM

## 2020-03-01 DIAGNOSIS — S161XXA Strain of muscle, fascia and tendon at neck level, initial encounter: Secondary | ICD-10-CM

## 2020-03-01 NOTE — Progress Notes (Signed)
Office Visit Note   Patient: Connie Horton           Date of Birth: Apr 04, 1991           MRN: [id number] Visit Date: 03/01/2020              Requested by: No referring provider defined for this encounter. PCP: Patient, No Pcp Per   Assessment & Plan: Visit Diagnoses:  1. Impingement syndrome of right shoulder   2. Contusion of right shoulder, initial encounter   3. Strain of neck muscle, initial encounter     Plan: Since patient continues have ongoing right shoulder pain after injury that has failed conservative treatment I recommend proceeding with right shoulder MRI arthrogram to rule out rotator cuff injury and other shoulder pathology including labral injury.  On exam patient does have a positive apprehension test along with positive impingement test.  Patient was given another note to keep her out of work x2 weeks.  I did advise patient that I could release her to doing light duty with no using the right upper extremity but she stated that her job does not have light duty available to her.  I advised her to let her job know that I cannot give a specific date when she will be able to return back to work since I have been trying to figure out as to exactly what is going on with her shoulder.  After her scan she will follow-up with orthopedic surgeon Dr. Dorene Grebe here in this office to review the results and discuss any potential treatment options which may or may not include surgical intervention.  He will also discuss return to work status at that time depending upon his findings.  Follow-Up Instructions: Return in about 2 weeks (around 03/15/2020) for with dr dean to  review right shoulder mri arthrogram.   Orders:  Orders Placed This Encounter  Procedures  . DG Arthro Shoulder Right  . MR SHOULDER RIGHT WO CONTRAST   No orders of the defined types were placed in this encounter.     Procedures: No procedures performed   Clinical Data: No additional  findings.   Subjective: Chief Complaint  Patient presents with  . Right Shoulder - Pain    HPI 29 year old female returns for recheck of her neck and right shoulder.  Last office visit patient was given a Medrol Dosepak taper and she states that this did not give any improvement of her pain.  She continues have ongoing pain in her shoulder with overhead activity and reaching on her back.  Not able to lift with her right arm.  No cervical radicular component.  She also continues to have some discomfort into the right side of her neck and right trapezius. Review of Systems No current cardiac pulmonary GI GU issues  Objective: Vital Signs: There were no vitals taken for this visit.  Physical Exam HENT:     Head: Normocephalic.     Nose: Nose normal.  Pulmonary:     Effort: No respiratory distress.  Musculoskeletal:     Comments: Cervical spine better range of motion.  Has some tenderness over the right trapezius but this is somewhat improved.  Right shoulder continues have some limitation in range of motion with discomfort.  Positive impingement test.  Positive apprehension test.  Pain with supraspinatus resistance.  Negative drop arm test.  Neurovascular intact.  Neurological:     General: No focal deficit present.     Mental Status: She  is alert and oriented to person, place, and time.     Ortho Exam  Specialty Comments:  No specialty comments available.  Imaging: No results found.   PMFS History: There are no problems to display for this patient.  Past Medical History:  Diagnosis Date  . Asthma     History reviewed. No pertinent family history.  History reviewed. No pertinent surgical history. Social History   Occupational History  . Not on file  Tobacco Use  . Smoking status: Never Smoker  . Smokeless tobacco: Never Used  Vaping Use  . Vaping Use: Every day  Substance and Sexual Activity  . Alcohol use: Yes    Comment: occassional  . Drug use: Not  Currently  . Sexual activity: Yes

## 2020-04-18 ENCOUNTER — Other Ambulatory Visit (HOSPITAL_COMMUNITY)
Admission: RE | Admit: 2020-04-18 | Discharge: 2020-04-18 | Disposition: A | Discharge status: Home / Self Care | Payer: Medicaid Other | Source: Ambulatory Visit | Attending: Internal Medicine | Admitting: Internal Medicine

## 2020-04-18 ENCOUNTER — Encounter: Payer: Self-pay | Admitting: Internal Medicine

## 2020-04-18 ENCOUNTER — Ambulatory Visit: Payer: Medicaid Other | Admitting: Internal Medicine

## 2020-04-18 ENCOUNTER — Other Ambulatory Visit: Payer: Self-pay

## 2020-04-18 VITALS — BP 140/90 | HR 68 | Temp 98.4°F | Ht 66.0 in | Wt 175.7 lb

## 2020-04-18 DIAGNOSIS — Z202 Contact with and (suspected) exposure to infections with a predominantly sexual mode of transmission: Secondary | ICD-10-CM

## 2020-04-18 DIAGNOSIS — Z113 Encounter for screening for infections with a predominantly sexual mode of transmission: Secondary | ICD-10-CM

## 2020-04-18 DIAGNOSIS — J4520 Mild intermittent asthma, uncomplicated: Secondary | ICD-10-CM

## 2020-04-18 DIAGNOSIS — Z124 Encounter for screening for malignant neoplasm of cervix: Secondary | ICD-10-CM | POA: Diagnosis not present

## 2020-04-18 DIAGNOSIS — R030 Elevated blood-pressure reading, without diagnosis of hypertension: Secondary | ICD-10-CM

## 2020-04-18 NOTE — Progress Notes (Addendum)
New Patient Office Visit     This visit occurred during the SARS-CoV-2 public health emergency.  Safety protocols were in place, including screening questions prior to the visit, additional usage of staff PPE, and extensive cleaning of exam room while observing appropriate contact time as indicated for disinfecting solutions.    CC/Reason for Visit: Establish care, requests STD screening Previous PCP: None Last Visit: Unknown  HPI: Connie Horton is a 28 y.o. female who is coming in today for the above mentioned reasons. Past Medical History is significant for: Abnormal Pap smear that required colposcopy with biopsy in the past, she is unable to give me any further details.  She has 2 daughters ages 82 and 4, she vapes socially, no cigarette use.  Her family history significant for both parents with coronary artery disease and CVA.  She states that she has frequent BV infections.  She tells me that she would like to get screening for STD as her boyfriend has been having yellowish discharge and she thinks she was diagnosed with chlamydia.  She just started a new job at the [employer].  Her blood pressure is noted to be elevated today to 140/90, no known history of hypertension.   Past Medical/Surgical History: Past Medical History:  Diagnosis Date  . Asthma     History reviewed. No pertinent surgical history.  Social History:  reports that she has never smoked. She has never used smokeless tobacco. She reports current alcohol use. She reports previous drug use.  Allergies: Allergies  Allergen Reactions  . Citrus Anaphylaxis  . Latex Anaphylaxis    Family History:  Family History  Problem Relation Age of Onset  . CAD Mother   . CVA Mother   . CAD Father   . CVA Father      Current Outpatient Medications:  .  albuterol (VENTOLIN HFA) 108 (90 Base) MCG/ACT inhaler, Inhale 2 puffs into the lungs every 6 (six) hours as needed for wheezing or shortness of breath., Disp: ,  Rfl:  .  diphenhydrAMINE (BENADRYL) 25 MG tablet, Take 25 mg by mouth every 6 (six) hours as needed for itching or allergies., Disp: , Rfl:  .  ibuprofen (ADVIL) 800 MG tablet, Take 1 tablet (800 mg total) by mouth 3 (three) times daily., Disp: 21 tablet, Rfl: 0  Review of Systems:  Constitutional: Denies fever, chills, diaphoresis, appetite change and fatigue.  HEENT: Denies photophobia, eye pain, redness, hearing loss, ear pain, congestion, sore throat, rhinorrhea, sneezing, mouth sores, trouble swallowing, neck pain, neck stiffness and tinnitus.   Respiratory: Denies SOB, DOE, cough, chest tightness,  and wheezing.   Cardiovascular: Denies chest pain, palpitations and leg swelling.  Gastrointestinal: Denies nausea, vomiting, abdominal pain, diarrhea, constipation, blood in stool and abdominal distention.  Genitourinary: Denies dysuria, urgency, frequency, hematuria, flank pain and difficulty urinating.  Endocrine: Denies: hot or cold intolerance, sweats, changes in hair or nails, polyuria, polydipsia. Musculoskeletal: Denies myalgias, back pain, joint swelling, arthralgias and gait problem.  Skin: Denies pallor, rash and wound.  Neurological: Denies dizziness, seizures, syncope, weakness, light-headedness, numbness and headaches.  Hematological: Denies adenopathy. Easy bruising, personal or family bleeding history  Psychiatric/Behavioral: Denies suicidal ideation, mood changes, confusion, nervousness, sleep disturbance and agitation    Physical Exam: Vitals:   04/18/20 1342  BP: 140/90  Pulse: 68  Temp: 98.4 F (36.9 C)  TempSrc: Oral  SpO2: 98%  Weight: 175 lb 11.2 oz (79.7 kg)  Height: 5\' 6"  (1.676 m)  Body mass index is 28.36 kg/m.  Constitutional: NAD, calm, comfortable Eyes: PERRL, lids and conjunctivae normal ENMT: Mucous membranes are moist. Respiratory: clear to auscultation bilaterally, no wheezing, no crackles. Normal respiratory effort. No accessory muscle use.   Cardiovascular: Regular rate and rhythm, no murmurs / rubs / gallops. No extremity edema.  Neurologic: Grossly intact and nonfocal Psychiatric: Normal judgment and insight. Alert and oriented x 3. Normal mood.    Impression and Plan:  Screen for STD (sexually transmitted disease)/Venereal disease contact  - Plan: HIV antibody (with reflex), RPR, Hepatitis panel, acute, PAP [[place]], Cervicovaginal ancillary only -We have discussed safe sexual practices.  Elevated BP without diagnosis of hypertension -Blood pressure noted to be 140/90 in office today. -She will do ambulatory blood pressure monitoring and return in 3 months for follow-up.  Mild intermittent asthma without complication -Well-controlled on as needed albuterol, no recent flareups.  Cervical cancer screening -Pap smear collected in office today.    Patient Instructions  -Nice seeing you today!!  -Lab work today; will notify you once results are available.  -Keep checking blood pressure every so often and bring measurements when you see me next.  -Schedule follow up in 3 months. Please come in fasting that day.     Chaya Jan, MD [facility]

## 2020-04-18 NOTE — Addendum Note (Signed)
Addended by: Lerry Liner on: 04/18/2020 03:03 PM   Modules accepted: Orders

## 2020-04-18 NOTE — Patient Instructions (Signed)
-  Nice seeing you today!!  -Lab work today; will notify you once results are available.  -Keep checking blood pressure every so often and bring measurements when you see me next.  -Schedule follow up in 3 months. Please come in fasting that day.

## 2020-04-19 LAB — HEPATITIS PANEL, ACUTE
Hep A IgM: NONREACTIVE
Hep B C IgM: NONREACTIVE
Hepatitis B Surface Ag: NONREACTIVE
Hepatitis C Ab: NONREACTIVE
SIGNAL TO CUT-OFF: 0.02 (ref <1.00)

## 2020-04-20 LAB — CERVICOVAGINAL ANCILLARY ONLY
Bacterial Vaginitis (gardnerella): POSITIVE — AB
Candida Glabrata: NEGATIVE
Candida Vaginitis: NEGATIVE
Comment: NEGATIVE
Comment: NEGATIVE
Comment: NEGATIVE

## 2020-04-23 ENCOUNTER — Telehealth: Payer: Self-pay | Admitting: Internal Medicine

## 2020-04-23 NOTE — Telephone Encounter (Signed)
Pt said she tested positive for Bacterial Vaginitis ([place]). Positive ; she saw her results on my chart want to know if the Dr would call in medication to treat. The patient would like to know when will her other results be ready to view

## 2020-04-23 NOTE — Telephone Encounter (Signed)
Confirm she is not pregnant.    If not, start Metronidazole 500 mg po bid for 7 days.  She should avoid alcohol while taking the Metronidazole.

## 2020-04-23 NOTE — Telephone Encounter (Signed)
Can you advise in Dr. Hernandez's absence? 

## 2020-04-24 ENCOUNTER — Telehealth (INDEPENDENT_AMBULATORY_CARE_PROVIDER_SITE_OTHER): Payer: Medicaid Other | Admitting: Family Medicine

## 2020-04-24 DIAGNOSIS — R0789 Other chest pain: Secondary | ICD-10-CM | POA: Diagnosis not present

## 2020-04-24 DIAGNOSIS — R0602 Shortness of breath: Secondary | ICD-10-CM

## 2020-04-24 MED ORDER — METRONIDAZOLE 500 MG PO TABS: 500.0000 mg | ORAL_TABLET | Freq: Two times a day (BID) | ORAL | 0 refills | Status: AC

## 2020-04-24 MED ORDER — ALBUTEROL SULFATE HFA 108 (90 BASE) MCG/ACT IN AERS
2.0000 | INHALATION_SPRAY | Freq: Four times a day (QID) | RESPIRATORY_TRACT | 0 refills | Status: DC | PRN
Start: 2020-04-24 — End: 2021-01-04

## 2020-04-24 NOTE — Telephone Encounter (Signed)
I spoke with pt - no chance of being pregnant. Aware that Rx has been sent in and to avoid alcohol while taking medication.  Placed call to [facility] to see if Pap was completed - they are still waiting on HSV results. Advised pt we would let her know once the results are back. Pt verbalized understanding.

## 2020-04-24 NOTE — Patient Instructions (Signed)
Go right away to the local emergency room or urgent care for evaluation.  Call [phone]  for transportation if worsening or if you do not feel safe to drive there.

## 2020-04-24 NOTE — Progress Notes (Signed)
Virtual Visit via Telephone Note  I connected with Connie Horton on 04/24/20 at  3:40 PM EST by telephone and verified that I am speaking with the correct person using two identifiers.   I discussed the limitations, risks, security and privacy concerns of performing an evaluation and management service by telephone and the availability of in person appointments. I also discussed with the patient that there may be a patient responsible charge related to this service. The patient expressed understanding and agreed to proceed.  Location patient: home, [place] Location provider: work or home office Participants present for the call: patient, provider Patient did not have a visit with me in the prior 7 days to address this/these issue(s).   History of Present Illness:  Acute telemedicine visit for : -Onset: yesterday -Symptoms include: sore throat, dry cough, feels tired, slight body aches, chest tightness, SOB, wheezing (feels like ran until out of breath even at rest) - not relieved with her inhaler when she tried it earlier today -boyfriend has been sick as well and has pending covid test -she did a covid test today - results pending -ran out of her albuterol this morning -Denies: fevers, NVD -Has tried: 2 puffs of alb - requesting refill  -Pertinent past medical history: asthma    Observations/Objective: Patient sounds cheerful and well on the phone. I do not appreciate any SOB. Speech and thought processing are grossly intact. Patient reported vitals:  Assessment and Plan:  SOB (shortness of breath)  Chest discomfort  -we discussed possible serious and likely etiologies, options for evaluation and workup, limitations of telemedicine visit vs in person visit, treatment, treatment risks and precautions.  Given the shortness of breath and chest discomfort, particularly that it did not feel better with the use of her albuterol, advised she needs in person evaluation at a higher level of  care right away.  She has opted to go by private vehicle to the med [facility] ER or a local urgent care.  Discussed options.  She feels that she does not need EMS transport at this time.  She agrees to go right away.   Did let the patient know that I only do telemedicine shifts for [place] on Tuesdays and Thursdays and advised a follow up visit with PCP or at an [facility] if has further questions or concerns.   Follow Up Instructions:  I did not refer this patient for an OV with me in the next 24 hours for this/these issue(s).  I discussed the assessment and treatment plan with the patient. The patient was provided an opportunity to ask questions and all were answered. The patient agreed with the plan and demonstrated an understanding of the instructions.   I spent 16 minutes on this encounter.   Terressa Koyanagi, DO

## 2020-04-25 LAB — CYTOLOGY - PAP
Chlamydia: NEGATIVE
Comment: NEGATIVE
Comment: NEGATIVE
Comment: NEGATIVE
Comment: NEGATIVE
Comment: NORMAL
Diagnosis: NEGATIVE
HSV1: NEGATIVE
HSV2: NEGATIVE
High risk HPV: NEGATIVE
Neisseria Gonorrhea: NEGATIVE
Trichomonas: NEGATIVE

## 2020-05-02 NOTE — Addendum Note (Signed)
Addended by: Lerry Liner on: 05/02/2020 10:29 AM   Modules accepted: Orders

## 2020-05-07 ENCOUNTER — Other Ambulatory Visit: Payer: Self-pay

## 2020-05-08 ENCOUNTER — Other Ambulatory Visit: Payer: Self-pay

## 2020-05-08 ENCOUNTER — Encounter (HOSPITAL_COMMUNITY): Payer: Self-pay

## 2020-05-08 DIAGNOSIS — M542 Cervicalgia: Secondary | ICD-10-CM | POA: Diagnosis not present

## 2020-05-08 DIAGNOSIS — Z5321 Procedure and treatment not carried out due to patient leaving prior to being seen by health care provider: Secondary | ICD-10-CM | POA: Diagnosis not present
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
## 2020-05-09 ENCOUNTER — Emergency Department (HOSPITAL_COMMUNITY)
Admission: EM | Admit: 2020-05-09 | Discharge: 2020-05-09 | Disposition: A | Discharge status: Home / Self Care | Payer: Medicaid Other | Attending: Emergency Medicine | Admitting: Emergency Medicine

## 2020-05-09 DIAGNOSIS — Z5321 Procedure and treatment not carried out due to patient leaving prior to being seen by health care provider: Secondary | ICD-10-CM

## 2020-05-10 ENCOUNTER — Other Ambulatory Visit: Payer: Self-pay

## 2020-05-10 ENCOUNTER — Emergency Department (HOSPITAL_COMMUNITY)
Admission: EM | Admit: 2020-05-10 | Discharge: 2020-05-10 | Disposition: A | Discharge status: Home / Self Care | Payer: Medicaid Other | Attending: Emergency Medicine | Admitting: Emergency Medicine

## 2020-05-10 DIAGNOSIS — R519 Headache, unspecified: Secondary | ICD-10-CM | POA: Insufficient documentation

## 2020-05-10 DIAGNOSIS — S46811A Strain of other muscles, fascia and tendons at shoulder and upper arm level, right arm, initial encounter: Secondary | ICD-10-CM | POA: Insufficient documentation

## 2020-05-10 DIAGNOSIS — J4520 Mild intermittent asthma, uncomplicated: Secondary | ICD-10-CM | POA: Insufficient documentation

## 2020-05-10 DIAGNOSIS — Y92410 Unspecified street and highway as the place of occurrence of the external cause: Secondary | ICD-10-CM | POA: Diagnosis not present

## 2020-05-10 DIAGNOSIS — Z91040 Latex allergy status: Secondary | ICD-10-CM | POA: Insufficient documentation

## 2020-05-10 DIAGNOSIS — S199XXA Unspecified injury of neck, initial encounter: Secondary | ICD-10-CM | POA: Diagnosis present

## 2020-05-10 MED ORDER — CYCLOBENZAPRINE HCL 10 MG PO TABS: 10.0000 mg | ORAL_TABLET | Freq: Two times a day (BID) | ORAL | 0 refills | Status: DC | PRN

## 2020-05-10 NOTE — Discharge Instructions (Signed)
Use 2 Tylenol and 2 ibuprofen every 6 hours together for pain and you can use the muscle relaxer as needed for spasm.  Also a heating pad or hot shower may help.
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
## 2020-05-11 ENCOUNTER — Ambulatory Visit: Payer: Self-pay | Admitting: Internal Medicine

## 2020-06-26 ENCOUNTER — Other Ambulatory Visit: Payer: Self-pay

## 2020-06-27 ENCOUNTER — Ambulatory Visit: Payer: Medicaid Other | Admitting: Internal Medicine

## 2020-07-05 ENCOUNTER — Ambulatory Visit: Payer: Medicaid Other | Admitting: Internal Medicine

## 2020-07-18 DIAGNOSIS — Z3202 Encounter for pregnancy test, result negative: Secondary | ICD-10-CM | POA: Diagnosis not present

## 2020-07-18 DIAGNOSIS — Z01419 Encounter for gynecological examination (general) (routine) without abnormal findings: Secondary | ICD-10-CM | POA: Diagnosis not present

## 2020-07-18 DIAGNOSIS — N898 Other specified noninflammatory disorders of vagina: Secondary | ICD-10-CM | POA: Diagnosis not present

## 2020-07-18 DIAGNOSIS — R300 Dysuria: Secondary | ICD-10-CM | POA: Diagnosis not present

## 2021-01-04 ENCOUNTER — Encounter: Payer: Self-pay | Admitting: Adult Health

## 2021-01-04 ENCOUNTER — Telehealth (INDEPENDENT_AMBULATORY_CARE_PROVIDER_SITE_OTHER): Payer: Medicaid Other | Admitting: Adult Health

## 2021-01-04 ENCOUNTER — Encounter: Payer: Self-pay | Admitting: Internal Medicine

## 2021-01-04 VITALS — Ht 66.0 in | Wt 173.0 lb

## 2021-01-04 DIAGNOSIS — R112 Nausea with vomiting, unspecified: Secondary | ICD-10-CM

## 2021-01-04 DIAGNOSIS — J4520 Mild intermittent asthma, uncomplicated: Secondary | ICD-10-CM

## 2021-01-04 DIAGNOSIS — B349 Viral infection, unspecified: Secondary | ICD-10-CM

## 2021-01-04 DIAGNOSIS — R197 Diarrhea, unspecified: Secondary | ICD-10-CM | POA: Diagnosis not present

## 2021-01-04 MED ORDER — ONDANSETRON HCL 4 MG PO TABS: 4.0000 mg | ORAL_TABLET | Freq: Three times a day (TID) | ORAL | 1 refills | Status: DC | PRN

## 2021-01-04 MED ORDER — ALBUTEROL SULFATE HFA 108 (90 BASE) MCG/ACT IN AERS: 2.0000 | INHALATION_SPRAY | Freq: Four times a day (QID) | RESPIRATORY_TRACT | 0 refills | Status: DC | PRN

## 2021-01-04 MED ORDER — BENZONATATE 200 MG PO CAPS: 200.0000 mg | ORAL_CAPSULE | Freq: Two times a day (BID) | ORAL | 1 refills | Status: DC | PRN

## 2021-01-04 NOTE — Progress Notes (Signed)
Virtual Visit via Video Note  I connected with Connie Horton on 01/04/21 at  4:00 PM EDT by a video enabled telemedicine application and verified that I am speaking with the correct person using two identifiers.  Location patient: home Location provider:work or home office Persons participating in the virtual visit: patient, provider  I discussed the limitations of evaluation and management by telemedicine and the availability of in person appointments. The patient expressed understanding and agreed to proceed.   HPI: 30 year old female who is being evaluated today for an acute issue.  Her symptoms started 2 days ago with diarrhea, nausea, vomiting, headaches, chills, subjective fever, sore throat, and dry cough.  He has not been using anything over-the-Horton to help with her symptoms as she did not want to go out and get other people sick.  Her young daughter just started having the same symptoms.  She was tested for COVID-19, but result is not back yet.  She also needs a refill of her rescue inhaler    ROS: See pertinent positives and negatives per HPI.  Past Medical History:  Diagnosis Date   Asthma     History reviewed. No pertinent surgical history.  Family History  Problem Relation Age of Onset   CAD Mother    CVA Mother    CAD Father    CVA Father        Current Outpatient Medications:    albuterol (VENTOLIN HFA) 108 (90 Base) MCG/ACT inhaler, Inhale 2 puffs into the lungs every 6 (six) hours as needed for wheezing or shortness of breath., Disp: 1 each, Rfl: 0   cyclobenzaprine (FLEXERIL) 10 MG tablet, Take 1 tablet (10 mg total) by mouth 2 (two) times daily as needed for muscle spasms., Disp: 15 tablet, Rfl: 0   diphenhydrAMINE (BENADRYL) 25 MG tablet, Take 25 mg by mouth every 6 (six) hours as needed for itching or allergies., Disp: , Rfl:    ibuprofen (ADVIL) 800 MG tablet, Take 1 tablet (800 mg total) by mouth 3 (three) times daily., Disp: 21 tablet, Rfl:  0  EXAM:  VITALS per patient if applicable:  GENERAL: alert, oriented, appears well and in no acute distress  HEENT: atraumatic, conjunttiva clear, no obvious abnormalities on inspection of external nose and ears  NECK: normal movements of the head and neck  LUNGS: on inspection no signs of respiratory distress, breathing rate appears normal, no obvious gross SOB, gasping or wheezing  CV: no obvious cyanosis  MS: moves all visible extremities without noticeable abnormality  PSYCH/NEURO: pleasant and cooperative, no obvious depression or anxiety, speech and thought processing grossly intact  ASSESSMENT AND PLAN:  Discussed the following assessment and plan:  Possibly COVID-19 infection versus other viral illness with gastroenteritis.  We will hold off on treating with antiviral medication since she is young and healthy.  We will send in Zofran, Tessalon Perles, and refill her albuterol inhaler for her.  She was also advised to pick up Pepto-Bismol.  Encouraged to stay hydrated and rest is much as possible.  Follow-up with PCP if no improvement in the next 2 to 3 days      I discussed the assessment and treatment plan with the patient. The patient was provided an opportunity to ask questions and all were answered. The patient agreed with the plan and demonstrated an understanding of the instructions.   The patient was advised to call back or seek an in-person evaluation if the symptoms worsen or if the condition fails to improve  as anticipated.   Dorothyann Peng, NP

## 2021-01-10 NOTE — Telephone Encounter (Signed)
Pt has been scheduled.  °

## 2021-01-10 NOTE — Telephone Encounter (Signed)
Pt ppw is due on the 13th of Sept. Connie Horton does not return until Sept.14th. Is there something else that can be done?

## 2021-01-10 NOTE — Telephone Encounter (Signed)
Noted  

## 2021-01-14 ENCOUNTER — Other Ambulatory Visit: Payer: Self-pay

## 2021-01-15 ENCOUNTER — Ambulatory Visit: Payer: Medicaid Other | Admitting: Internal Medicine

## 2021-01-15 VITALS — BP 120/80 | Temp 98.4°F | Wt 188.0 lb

## 2021-01-15 DIAGNOSIS — K529 Noninfective gastroenteritis and colitis, unspecified: Secondary | ICD-10-CM

## 2021-01-15 NOTE — Progress Notes (Signed)
Acute office Visit     This visit occurred during the SARS-CoV-2 public health emergency.  Safety protocols were in place, including screening questions prior to the visit, additional usage of staff PPE, and extensive cleaning of exam room while observing appropriate contact time as indicated for disinfecting solutions.    CC/Reason for Visit: Needs forms filled out  HPI: Connie Horton is a 30 y.o. female who is coming in today for the above mentioned reasons.  She is here today because she needs forms filled out for her job.  She was seen on a virtual visit by another an office provider on September 2 with COVID-like symptoms.  She had vomiting, diarrhea, headache sore throat.  The symptoms lasted around 4 days.  She did end up subsequently having a negative COVID test.  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  Her job is requesting an accommodation form for those days.  Past Medical/Surgical History: Past Medical History:  Diagnosis Date   Asthma     No past surgical history on file.  Social History:  reports that she has never smoked. She has never used smokeless tobacco. She reports current alcohol use. She reports that she does not currently use drugs.  Allergies: Allergies  Allergen Reactions   Citrus Anaphylaxis   Latex Anaphylaxis    Family History:  Family History  Problem Relation Age of Onset   CAD Mother    CVA Mother    CAD Father    CVA Father      Current Outpatient Medications:    albuterol (VENTOLIN HFA) 108 (90 Base) MCG/ACT inhaler, Inhale 2 puffs into the lungs every 6 (six) hours as needed for wheezing or shortness of breath., Disp: 1 each, Rfl: 0   cyclobenzaprine (FLEXERIL) 10 MG tablet, Take 1 tablet (10 mg total) by mouth 2 (two) times daily as needed for muscle spasms., Disp: 15 tablet, Rfl: 0    diphenhydrAMINE (BENADRYL) 25 MG tablet, Take 25 mg by mouth every 6 (six) hours as needed for itching or allergies., Disp: , Rfl:    ibuprofen (ADVIL) 800 MG tablet, Take 1 tablet (800 mg total) by mouth 3 (three) times daily., Disp: 21 tablet, Rfl: 0   ondansetron (ZOFRAN) 4 MG tablet, Take 1 tablet (4 mg total) by mouth every 8 (eight) hours as needed for nausea or vomiting., Disp: 20 tablet, Rfl: 1  Review of Systems:  Constitutional: Denies fever, chills, diaphoresis, appetite change and fatigue.  HEENT: Denies photophobia, eye pain, redness, hearing loss, ear pain, congestion, sore throat, rhinorrhea, sneezing, mouth sores, trouble swallowing, neck pain, neck stiffness and tinnitus.   Respiratory: Denies SOB, DOE, cough, chest tightness,  and wheezing.   Cardiovascular: Denies chest pain, palpitations and leg swelling.  Gastrointestinal: Denies nausea, vomiting, abdominal pain, diarrhea, constipation, blood in stool and abdominal distention.  Genitourinary: Denies dysuria, urgency, frequency, hematuria, flank pain and difficulty urinating.  Endocrine: Denies: hot or cold intolerance, sweats, changes in hair or nails, polyuria, polydipsia. Musculoskeletal: Denies myalgias, back pain, joint swelling, arthralgias and gait problem.  Skin: Denies pallor, rash and wound.  Neurological: Denies dizziness, seizures, syncope, weakness, light-headedness, numbness and headaches.  Hematological: Denies adenopathy. Easy bruising, personal or family bleeding history  Psychiatric/Behavioral: Denies suicidal ideation, mood changes, confusion, nervousness, sleep disturbance and agitation    Physical Exam:  Vitals:   01/15/21 1023  BP: 120/80  Temp: 98.4 F (36.9 C)  TempSrc: Oral  Weight: 188 lb (85.3 kg)    Body mass index is 30.34 kg/m.   Constitutional: NAD, calm, comfortable Eyes: PERRL, lids and conjunctivae normal ENMT: Mucous membranes are moist.  Neurologic: Grossly intact and  nonfocal Psychiatric: Normal judgment and insight. Alert and oriented x 3. Normal mood.    Impression and Plan:  Acute gastroenteritis -Symptoms have completely resolved. -She is requesting an accommodation form be signed for the dates of August 31 to September 2. -Forms have been filled out today.  Time spent: 21 minutes interviewing patient, reviewing chart, filling out necessary forms.     Chaya Jan, MD [facility] at [facility]

## 2021-02-07 ENCOUNTER — Encounter: Payer: Self-pay | Admitting: Internal Medicine

## 2021-02-07 ENCOUNTER — Ambulatory Visit: Payer: Medicaid Other | Admitting: Internal Medicine

## 2021-02-07 ENCOUNTER — Other Ambulatory Visit: Payer: Self-pay

## 2021-02-07 VITALS — BP 130/80 | HR 79 | Temp 99.0°F | Wt 193.0 lb

## 2021-02-07 DIAGNOSIS — M76821 Posterior tibial tendinitis, right leg: Secondary | ICD-10-CM | POA: Diagnosis not present

## 2021-02-07 DIAGNOSIS — M79671 Pain in right foot: Secondary | ICD-10-CM

## 2021-02-07 DIAGNOSIS — Z23 Encounter for immunization: Secondary | ICD-10-CM | POA: Diagnosis not present

## 2021-02-07 NOTE — Addendum Note (Signed)
Addended by: Kern Reap B on: 02/07/2021 04:28 PM   Modules accepted: Orders

## 2021-02-07 NOTE — Patient Instructions (Addendum)
-  Nice seeing you today!!  -Referral to sports medicine placed.  -Flu vaccine today.

## 2021-02-07 NOTE — Progress Notes (Signed)
Acute office Visit     This visit occurred during the SARS-CoV-2 public health emergency.  Safety protocols were in place, including screening questions prior to the visit, additional usage of staff PPE, and extensive cleaning of exam room while observing appropriate contact time as indicated for disinfecting solutions.    CC/Reason for Visit: Right foot pain  HPI: Connie Horton is a 30 y.o. female who is coming in today for the above mentioned reasons.  She started a second job at [employer] and has been on her feet a lot wearing steel toed boots without any arch support.  She is flat-footed.  She has been noticing significant pain of her right foot around her arch and in her ankle.  Palpating behind the medial malleolus causes a lot of pain.  No injuries that she can recall.  Past Medical/Surgical History: Past Medical History:  Diagnosis Date   Asthma     No past surgical history on file.  Social History:  reports that she has never smoked. She has never used smokeless tobacco. She reports current alcohol use. She reports that she does not currently use drugs.  Allergies: Allergies  Allergen Reactions   Citrus Anaphylaxis   Latex Anaphylaxis    Family History:  Family History  Problem Relation Age of Onset   CAD Mother    CVA Mother    CAD Father    CVA Father      Current Outpatient Medications:    albuterol (VENTOLIN HFA) 108 (90 Base) MCG/ACT inhaler, Inhale 2 puffs into the lungs every 6 (six) hours as needed for wheezing or shortness of breath., Disp: 1 each, Rfl: 0   cyclobenzaprine (FLEXERIL) 10 MG tablet, Take 1 tablet (10 mg total) by mouth 2 (two) times daily as needed for muscle spasms., Disp: 15 tablet, Rfl: 0   diphenhydrAMINE (BENADRYL) 25 MG tablet, Take 25 mg by mouth every 6 (six) hours as needed for itching or allergies., Disp: , Rfl:    ibuprofen (ADVIL) 800 MG tablet, Take 1 tablet (800 mg total) by mouth 3 (three) times daily., Disp:  21 tablet, Rfl: 0   ondansetron (ZOFRAN) 4 MG tablet, Take 1 tablet (4 mg total) by mouth every 8 (eight) hours as needed for nausea or vomiting., Disp: 20 tablet, Rfl: 1  Review of Systems:  Constitutional: Denies fever, chills, diaphoresis, appetite change and fatigue.  HEENT: Denies photophobia, eye pain, redness, hearing loss, ear pain, congestion, sore throat, rhinorrhea, sneezing, mouth sores, trouble swallowing, neck pain, neck stiffness and tinnitus.   Respiratory: Denies SOB, DOE, cough, chest tightness,  and wheezing.   Cardiovascular: Denies chest pain, palpitations and leg swelling.  Gastrointestinal: Denies nausea, vomiting, abdominal pain, diarrhea, constipation, blood in stool and abdominal distention.  Genitourinary: Denies dysuria, urgency, frequency, hematuria, flank pain and difficulty urinating.  Endocrine: Denies: hot or cold intolerance, sweats, changes in hair or nails, polyuria, polydipsia. Musculoskeletal: Denies myalgias, back pain, joint swelling.  Skin: Denies pallor, rash and wound.  Neurological: Denies dizziness, seizures, syncope, weakness, light-headedness, numbness and headaches.  Hematological: Denies adenopathy. Easy bruising, personal or family bleeding history  Psychiatric/Behavioral: Denies suicidal ideation, mood changes, confusion, nervousness, sleep disturbance and agitation    Physical Exam: Vitals:   02/07/21 1606  BP: 130/80  Pulse: 79  Temp: 99 F (37.2 C)  TempSrc: Oral  SpO2: 98%  Weight: 193 lb (87.5 kg)    Body mass index is 31.15 kg/m.   Constitutional: NAD, calm, comfortable  Eyes: PERRL, lids and conjunctivae normal ENMT: Mucous membranes are moist.   Musculoskeletal: Pain surrounding her right arch and medial ankle.  Exquisite pain when touching behind her medial malleolus..  Neurologic: Grossly intact and nonfocal Psychiatric: Normal judgment and insight. Alert and oriented x 3. Normal mood.    Impression and  Plan:  Right foot pain  Posterior tibial tendinitis of right lower extremity  - Plan: Ambulatory referral to Sports Medicine -Suspect right posterior tibial tendinitis, I will refer to sports medicine. -Would likely benefit from orthotics and good shoe support.  Need for influenza vaccination -Flu vaccine administered today.  Time spent: 21 minutes reviewing chart, interviewing and examining patient and formulating plan of care.   Patient Instructions  -Nice seeing you today!!  -Referral to sports medicine placed.  -Flu vaccine today.      Chaya Jan, MD [facility] at [facility]

## 2021-02-11 ENCOUNTER — Ambulatory Visit: Payer: Self-pay

## 2021-02-11 ENCOUNTER — Other Ambulatory Visit: Payer: Self-pay

## 2021-02-11 ENCOUNTER — Ambulatory Visit: Payer: Medicaid Other | Admitting: Sports Medicine

## 2021-02-11 VITALS — BP 132/86 | HR 92 | Ht 66.0 in | Wt 192.0 lb

## 2021-02-11 DIAGNOSIS — M79671 Pain in right foot: Secondary | ICD-10-CM

## 2021-02-11 DIAGNOSIS — M76821 Posterior tibial tendinitis, right leg: Secondary | ICD-10-CM | POA: Diagnosis not present

## 2021-02-11 DIAGNOSIS — M2141 Flat foot [pes planus] (acquired), right foot: Secondary | ICD-10-CM

## 2021-02-11 DIAGNOSIS — M2142 Flat foot [pes planus] (acquired), left foot: Secondary | ICD-10-CM

## 2021-02-11 DIAGNOSIS — M76811 Anterior tibial syndrome, right leg: Secondary | ICD-10-CM

## 2021-02-11 MED ORDER — MELOXICAM 15 MG PO TABS: 15.0000 mg | ORAL_TABLET | Freq: Every day | ORAL | 0 refills | Status: DC

## 2021-02-11 NOTE — Patient Instructions (Addendum)
Good to see you  Check out [employer] for recommendation on inserts located on [employer] of work for two weeks Start meloxicam 15mg  daily for 2-3 weeks remainder as needed  Follow up in two weeks before you return to work

## 2021-02-11 NOTE — Progress Notes (Signed)
[facility] [address] [place] [address] Phone: [phone]   Assessment and Plan:     1. Right foot pain 2. Posterior tibial tendinitis of right lower extremity 3. Anterior tibialis tendinitis of right lower extremity -Multiple acute tendinopathy's, initial sports medicine visit - Likely acute strain of posterior tibial tendon and anterior tibial tendon due to wearing heavy, steel toed boots at her job based on HPI, physical exam, ultrasound - Start meloxicam 15 mg daily x2 weeks and may use remainder as needed for pain control - Start HEP for ankle - Recommended to use comfortable closed back shoes like tennis shoes - Recommended to be off of work for 2 weeks to allow for further healing and prevent reactivation -Ultrasound findings dictated below - [place] LIMITED JOINT SPACE STRUCTURES LOW RIGHT(NO LINKED CHARGES); Future  4.  Pes planus, bilateral - Chronic, initial sports medicine visit - Bilateral pes planus with tendency towards inversion of feet - Recommended patient go to [facility] for evaluation for inserts - Could consider custom orthotics if inserts do not provide sufficient relief   Pertinent previous records reviewed include PCP note   Follow Up: In 2 weeks for reevaluation prior to returning to work.  Could consider CSI versus formal PT.   Subjective:   I, Connie Horton, am serving as a scribe for Dr. Richardean Sale  Chief Complaint: Right foot pain   HPI:   02/11/21 Patient is a 30 year old female presenting with right foot pain since 02/05/2021. Patient was seen by her PCP on 02/07/21 for this issue and stated that she had started a second job at [employer] and is on her feet a lot. Patient does not recall an actual MOI. Patient states that she has been wearing steel toes boots without any arch support. Patient locates pain to right foot around the arch and in her ankle and describes pain as  throbbing in nature. Patient was referred to our office for treatment. Patient states that she has a flat foot so has had pain similar in the past but it is worse now than it has ever had in the past. Pain hurts worse with pressure and wearing a closed toe shoe. Patient states that the best shoe she can wear is her slide, but she knows she should wear something with more support. Patient states today her foot is not as swollen as it has been, but if she walks for too long the foot and toes will swell. Patient has to walk on her tiptoes to avoid the pain but then her toes will start to hurt.   Relevant Historical Information: Pes planus  Additional pertinent review of systems negative.   Current Outpatient Medications:    albuterol (VENTOLIN HFA) 108 (90 Base) MCG/ACT inhaler, Inhale 2 puffs into the lungs every 6 (six) hours as needed for wheezing or shortness of breath., Disp: 1 each, Rfl: 0   cyclobenzaprine (FLEXERIL) 10 MG tablet, Take 1 tablet (10 mg total) by mouth 2 (two) times daily as needed for muscle spasms., Disp: 15 tablet, Rfl: 0   diphenhydrAMINE (BENADRYL) 25 MG tablet, Take 25 mg by mouth every 6 (six) hours as needed for itching or allergies., Disp: , Rfl:    ibuprofen (ADVIL) 800 MG tablet, Take 1 tablet (800 mg total) by mouth 3 (three) times daily., Disp: 21 tablet, Rfl: 0   meloxicam (MOBIC) 15 MG tablet, Take 1 tablet (15 mg total) by mouth  daily., Disp: 30 tablet, Rfl: 0   ondansetron (ZOFRAN) 4 MG tablet, Take 1 tablet (4 mg total) by mouth every 8 (eight) hours as needed for nausea or vomiting., Disp: 20 tablet, Rfl: 1   Objective:     Vitals:   02/11/21 1411  BP: 132/86  Pulse: 92  SpO2: 98%  Weight: 192 lb (87.1 kg)  Height: 5\' 6"  (1.676 m)      Body mass index is 30.99 kg/m.    Physical Exam:    Gen: Appears well, nad, nontoxic and pleasant Psych: Alert and oriented, appropriate mood and affect Neuro: sensation intact, strength is 5/5 with df/pf/inv/ev,  muscle tone wnl Skin: no susupicious lesions or rashes  Right ankle: no deformity, no swelling or effusion TTP over anterior tibial tendon, posterior tibial tendon, arch NTTP over fibular head, lat mal, medial mal, achilles, navicular, base of 5th, ATFL, CFL, deltoid, calcaneous or midfoot ROM DF 30, PF 45, inv/ev intact Negative ant drawer, talar tilt, rotation test, squeeze test. Neg thompson Moderate pain with resisted inversion or eversion  Bilateral pes planus  Sports Medicine: Musculoskeletal Ultrasound.   Exam:US Complete right ankle/Lower Leg Exam.  Diagnosis: Right ankle pain  Findings:  Medial Ankle: Increase hypoechoic edema primarily along posterior tibial tendon Lateral Ankle: Mildly increased edema along peroneal tendons Posterior Ankle: Unremarkable appearing calcaneus and Achilles tendon Anterior Ankle: Increased edema along anterior tibial tendon Calf/lower leg: Unremarkable appearing Achilles tendon    US Impression:  1.  Posterior tibial tendinitis 2.  Anterior tibial tendinitis  Electronically signed by:  [place] [facility] 3:06 PM 02/11/21

## 2021-02-20 NOTE — Progress Notes (Addendum)
Aleen Sells [facility] [address] [place] [address] Phone: [phone]   Assessment and Plan:     1. Right foot pain 2. Posterior tibial tendinitis of right lower extremity -Acute, subsequent sports medicine visit - Likely acute strain of posterior tibial tendon due to wearing heavy steel toed boots at work - Patient has not had significant improvement using relative rest and meloxicam x2 weeks - Discontinue meloxicam - Start Tylenol/NSAIDs as needed for pain control - Wear cam boot walker at all times, coming out of boot once a day for ROM to prevent stiffness.  Cam boot provided. - Out of work for 2 weeks.  Work note provided  3. Pes planus of both feet -Chronic with exacerbation, subsequent visit - Patient was unable to find time to be evaluated for inserts - Recommend trialing supportive inserts for pes planus - Could consider custom orthotics if inserts do not provide sufficient relief   Pertinent previous records reviewed include none   Follow Up: In 2 weeks for reevaluation   Subjective:   I, Debbe Odea, am serving as a scribe for Dr. Richardean Sale  Chief Complaint: Right foot pain follow up   HPI:   02/11/21 Patient is a 30 year old female presenting with right foot pain since 02/05/2021. Patient was seen by her PCP on 02/07/21 for this issue and stated that she had started a second job at [employer] and is on her feet a lot. Patient does not recall an actual MOI. Patient states that she has been wearing steel toes boots without any arch support. Patient locates pain to right foot around the arch and in her ankle and describes pain as throbbing in nature. Patient was referred to our office for treatment. Patient states that she has a flat foot so has had pain similar in the past but it is worse now than it has ever had in the past. Pain hurts worse with pressure and wearing a closed toe shoe. Patient states  that the best shoe she can wear is her slide, but she knows she should wear something with more support. Patient states today her foot is not as swollen as it has been, but if she walks for too long the foot and toes will swell. Patient has to walk on her tiptoes to avoid the pain but then her toes will start to hurt.   02/21/21 Patient states been non weight bearing because of pain. Right foot. Same pain attempted to walk with inserts. Work note needs to be more specific. Pain on same medial side.  Relevant Historical Information: Pes planus  Additional pertinent review of systems negative.   Current Outpatient Medications:    albuterol (VENTOLIN HFA) 108 (90 Base) MCG/ACT inhaler, Inhale 2 puffs into the lungs every 6 (six) hours as needed for wheezing or shortness of breath., Disp: 1 each, Rfl: 0   cyclobenzaprine (FLEXERIL) 10 MG tablet, Take 1 tablet (10 mg total) by mouth 2 (two) times daily as needed for muscle spasms., Disp: 15 tablet, Rfl: 0   diphenhydrAMINE (BENADRYL) 25 MG tablet, Take 25 mg by mouth every 6 (six) hours as needed for itching or allergies., Disp: , Rfl:    ibuprofen (ADVIL) 800 MG tablet, Take 1 tablet (800 mg total) by mouth 3 (three) times daily., Disp: 21 tablet, Rfl: 0   meloxicam (MOBIC) 15 MG tablet, Take 1 tablet (15 mg total) by mouth daily., Disp: 30 tablet, Rfl: 0  ondansetron (ZOFRAN) 4 MG tablet, Take 1 tablet (4 mg total) by mouth every 8 (eight) hours as needed for nausea or vomiting., Disp: 20 tablet, Rfl: 1   Objective:     Vitals:   02/21/21 1128  BP: 124/86  Pulse: 86  Resp: (!) 98  Weight: 195 lb (88.5 kg)  Height: 5\' 6"  (1.676 m)      Body mass index is 31.47 kg/m.    Physical Exam:    Gen: Appears well, nad, nontoxic and pleasant Psych: Alert and oriented, appropriate mood and affect Neuro: sensation intact, strength is 5/5 with df/pf/inv/ev, muscle tone wnl Skin: no susupicious lesions or rashes   Right ankle: no deformity, no  swelling or effusion TTP over posterior tibial tendon, arch NTTP over fibular head, lat mal, medial mal, achilles, navicular, base of 5th, ATFL, CFL, deltoid, calcaneous or midfoot, anterior tibial tendon,  ROM DF 30, PF 45, inv/ev intact Negative ant drawer, talar tilt, rotation test, squeeze test. Neg thompson Moderate pain with resisted inversion or eversion  Bilateral pes planus   Electronically signed by:  [facility] 12:04 PM 02/21/21

## 2021-02-21 ENCOUNTER — Ambulatory Visit: Payer: Medicaid Other | Admitting: Sports Medicine

## 2021-02-21 ENCOUNTER — Ambulatory Visit: Payer: Medicaid Other | Admitting: Internal Medicine

## 2021-02-21 ENCOUNTER — Other Ambulatory Visit: Payer: Self-pay

## 2021-02-21 VITALS — BP 124/86 | HR 86 | Resp 98 | Ht 66.0 in | Wt 195.0 lb

## 2021-02-21 DIAGNOSIS — M76821 Posterior tibial tendinitis, right leg: Secondary | ICD-10-CM

## 2021-02-21 DIAGNOSIS — M79671 Pain in right foot: Secondary | ICD-10-CM

## 2021-02-21 DIAGNOSIS — M2141 Flat foot [pes planus] (acquired), right foot: Secondary | ICD-10-CM | POA: Diagnosis not present

## 2021-02-21 DIAGNOSIS — M2142 Flat foot [pes planus] (acquired), left foot: Secondary | ICD-10-CM | POA: Diagnosis not present

## 2021-02-21 NOTE — Patient Instructions (Signed)
Boot at all times unless shower and sleeping See you again in 2 weeks or sooner if you want injection Use tylenol and nsaids as needs for pain Can discount meloxicam

## 2021-02-22 ENCOUNTER — Ambulatory Visit: Payer: Medicaid Other | Admitting: Internal Medicine

## 2021-02-27 ENCOUNTER — Telehealth: Payer: Self-pay | Admitting: Sports Medicine

## 2021-02-27 IMAGING — CR DG SHOULDER 2+V*R*
3 series · 3 of 3 positions shown · non-contrast
Comparison: None.

CLINICAL DATA: Right shoulder pain x 2 days s/p falling from bed
onto arm.

EXAM:
RIGHT SHOULDER - 2+ VIEW

[w shoulder external right]
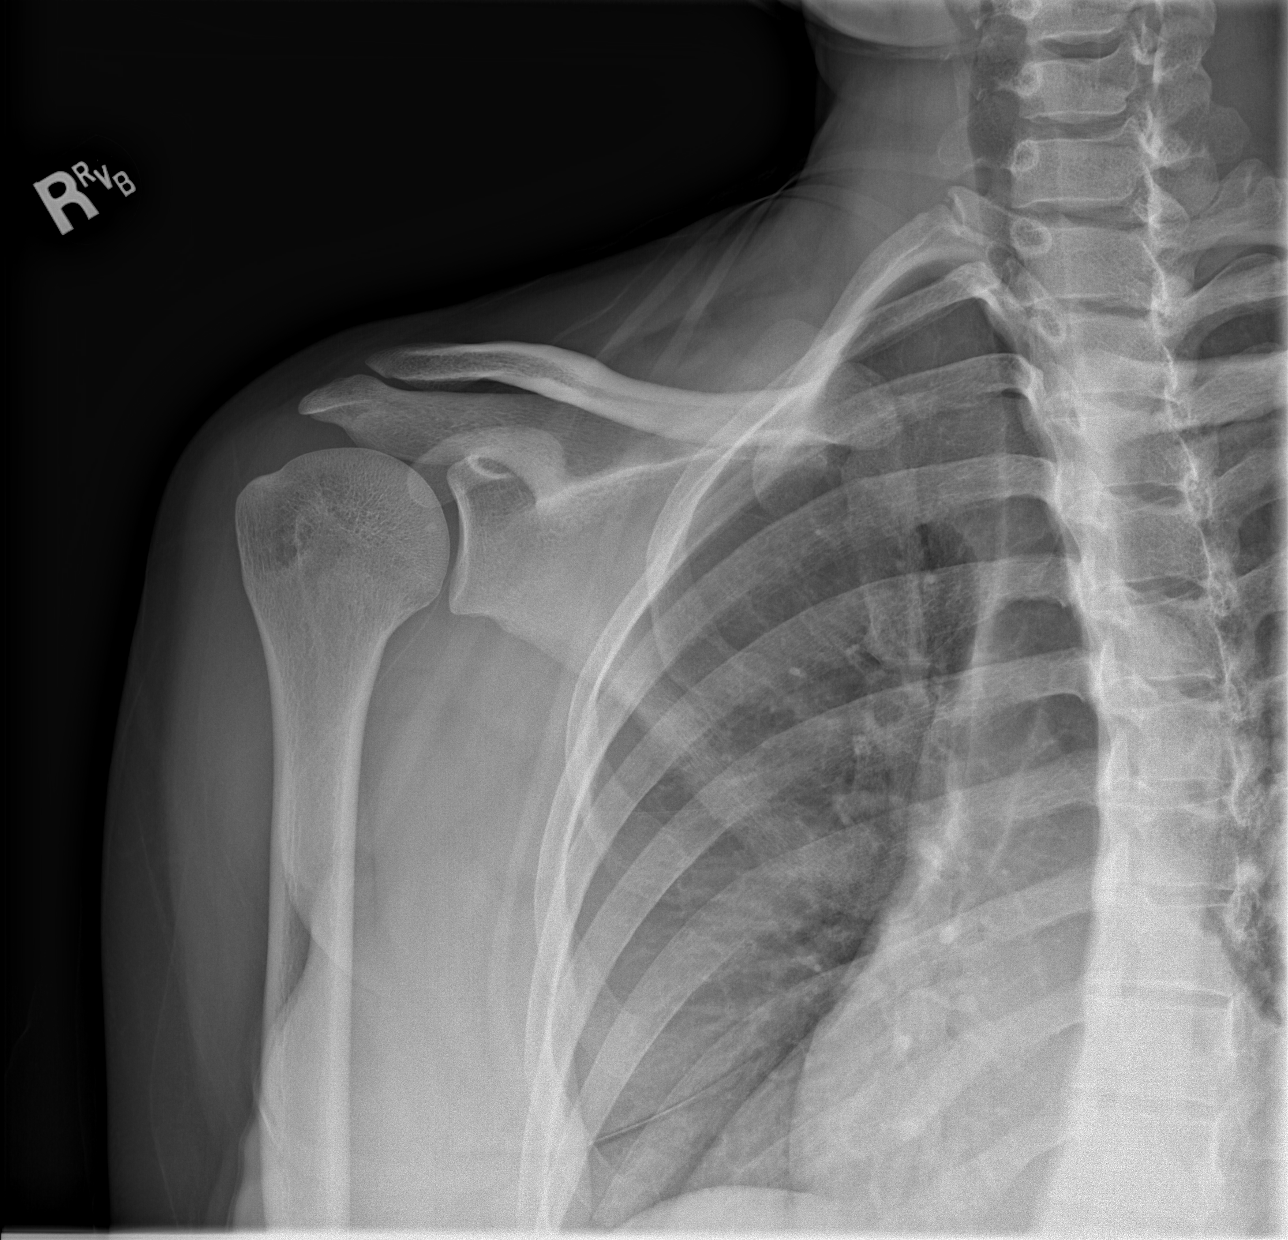

[w shoulder y-view right]
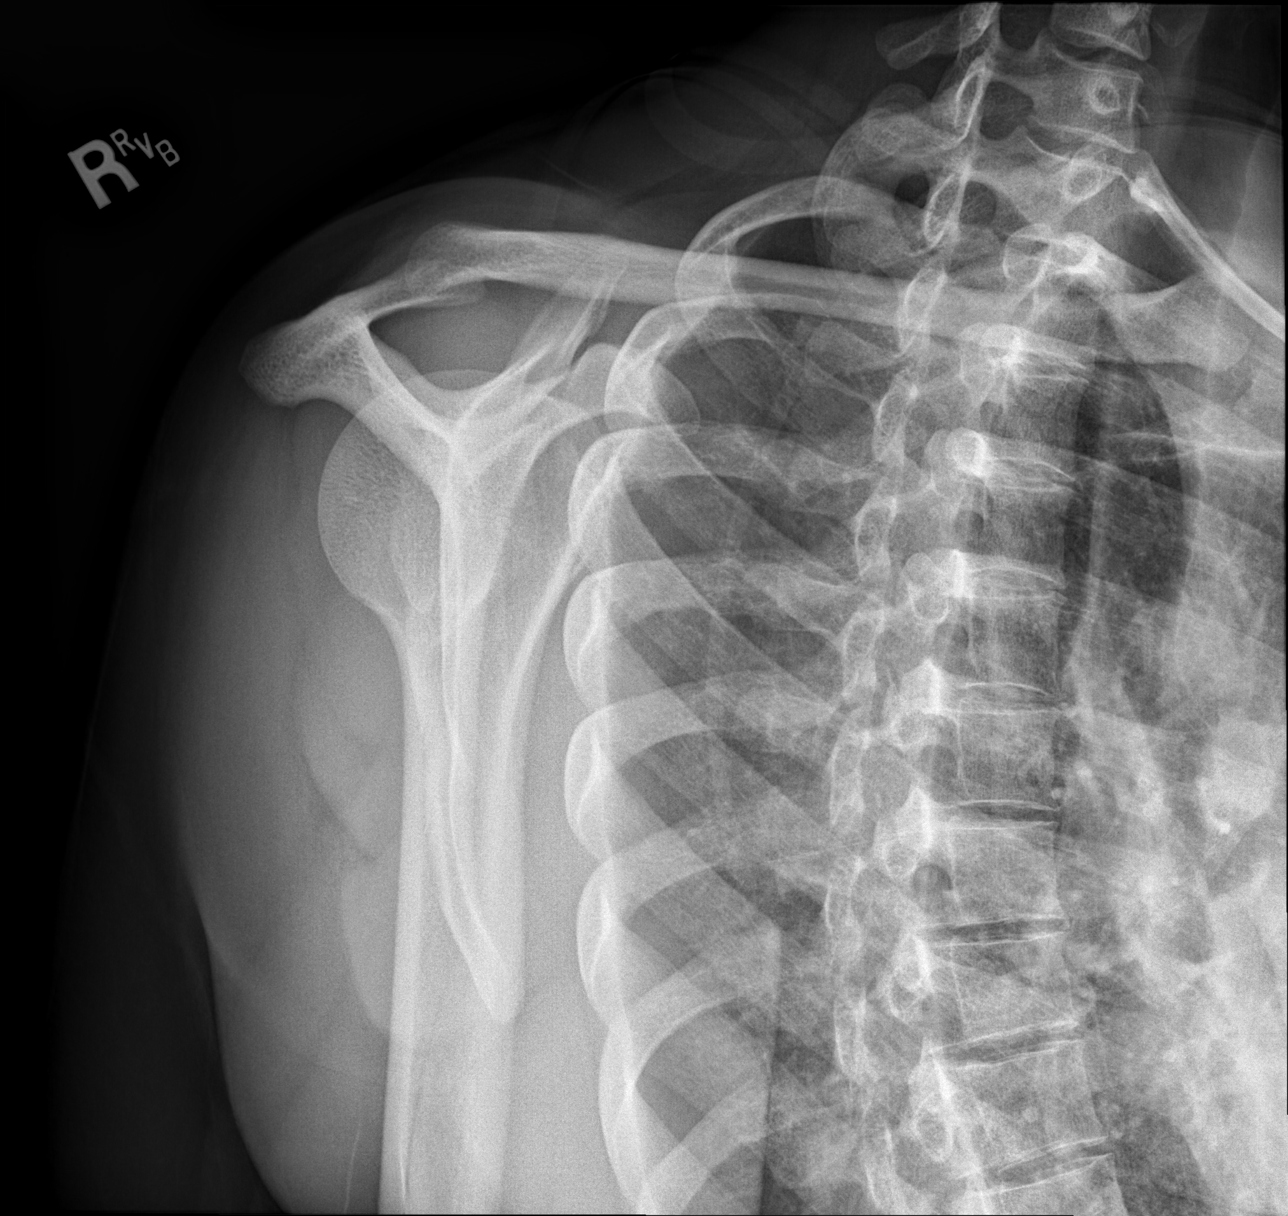

[x shoulder axillary right]
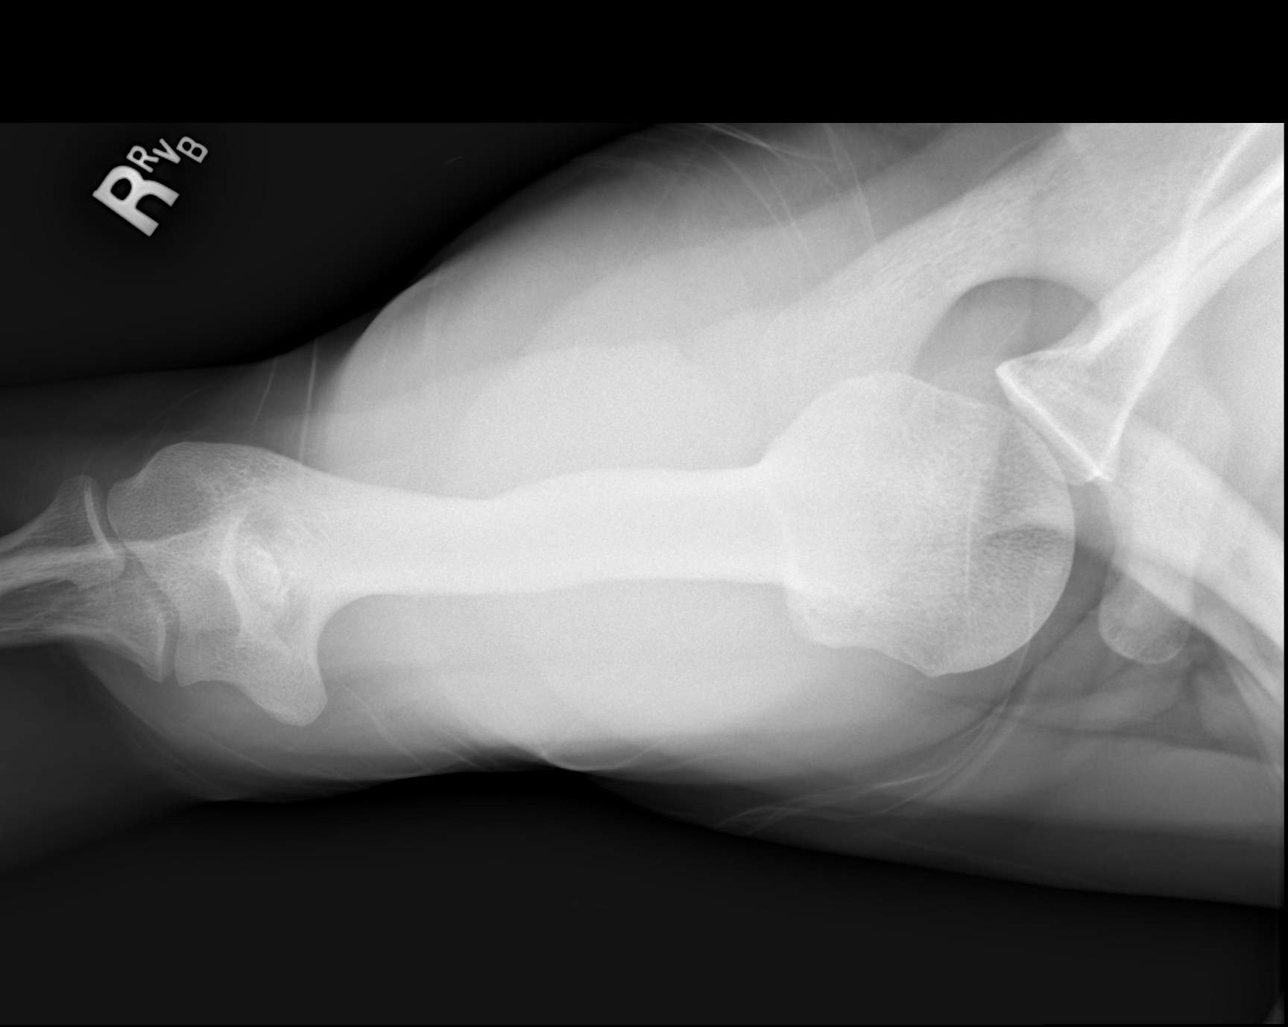

[3 of 3 positions shown; findings below may reference images not displayed]

FINDINGS: There is no evidence of fracture or dislocation. There is no
evidence of arthropathy or other focal bone abnormality. Soft
tissues are unremarkable.
IMPRESSION: Negative.

## 2021-02-27 NOTE — Telephone Encounter (Signed)
Letter re-written and sent through [facility]

## 2021-02-27 NOTE — Telephone Encounter (Signed)
Pt HR is not accepting the most recent [employer] work ( through 11/7 ). They require a reason pt cannot work.  Can we add reason to a letter and resend via MyChart?

## 2021-02-27 NOTE — Telephone Encounter (Signed)
Could you add this info to another letter for the pt?

## 2021-03-06 NOTE — Progress Notes (Deleted)
    Connie Horton [facility] [address] [place] [address] Phone: [phone]   Assessment and Plan:     There are no diagnoses linked to this encounter.  ***   Pertinent previous records reviewed include ***   Follow Up: ***     Subjective:    Chief Complaint: Right foot pain follow up  HPI:   02/11/21 Patient is a 30 year old female presenting with right foot pain since 02/05/2021. Patient was seen by her PCP on 02/07/21 for this issue and stated that she had started a second job at [employer] and is on her feet a lot. Patient does not recall an actual MOI. Patient states that she has been wearing steel toes boots without any arch support. Patient locates pain to right foot around the arch and in her ankle and describes pain as throbbing in nature. Patient was referred to our office for treatment. Patient states that she has a flat foot so has had pain similar in the past but it is worse now than it has ever had in the past. Pain hurts worse with pressure and wearing a closed toe shoe. Patient states that the best shoe she can wear is her slide, but she knows she should wear something with more support. Patient states today her foot is not as swollen as it has been, but if she walks for too long the foot and toes will swell. Patient has to walk on her tiptoes to avoid the pain but then her toes will start to hurt.    02/21/21 Patient states been non weight bearing because of pain. Right foot. Same pain attempted to walk with inserts. Work note needs to be more specific. Pain on same medial side.  03/07/21 Patient states   Relevant Historical Information: ***  Additional pertinent review of systems negative.   Current Outpatient Medications:    albuterol (VENTOLIN HFA) 108 (90 Base) MCG/ACT inhaler, Inhale 2 puffs into the lungs every 6 (six) hours as needed for wheezing or shortness of breath., Disp: 1 each, Rfl: 0    cyclobenzaprine (FLEXERIL) 10 MG tablet, Take 1 tablet (10 mg total) by mouth 2 (two) times daily as needed for muscle spasms., Disp: 15 tablet, Rfl: 0   diphenhydrAMINE (BENADRYL) 25 MG tablet, Take 25 mg by mouth every 6 (six) hours as needed for itching or allergies., Disp: , Rfl:    ibuprofen (ADVIL) 800 MG tablet, Take 1 tablet (800 mg total) by mouth 3 (three) times daily., Disp: 21 tablet, Rfl: 0   meloxicam (MOBIC) 15 MG tablet, Take 1 tablet (15 mg total) by mouth daily., Disp: 30 tablet, Rfl: 0   ondansetron (ZOFRAN) 4 MG tablet, Take 1 tablet (4 mg total) by mouth every 8 (eight) hours as needed for nausea or vomiting., Disp: 20 tablet, Rfl: 1   Objective:     There were no vitals filed for this visit.    There is no height or weight on file to calculate BMI.    Physical Exam:    ***   Electronically signed by:  Connie Horton [facility] 3:26 PM 03/06/21

## 2021-03-07 ENCOUNTER — Ambulatory Visit: Payer: Medicaid Other | Admitting: Sports Medicine

## 2021-03-11 NOTE — Progress Notes (Deleted)
    Connie Horton [facility] [address] [place] [address] Phone: [phone]   Assessment and Plan:     There are no diagnoses linked to this encounter.  ***   Pertinent previous records reviewed include ***   Follow Up: ***     Subjective:   I, Debbe Odea, am serving as a scribe for Dr. Richardean Sale  Chief Complaint: right foot pain follow up   HPI:     02/11/21 Patient is a 30 year old female presenting with right foot pain since 02/05/2021. Patient was seen by her PCP on 02/07/21 for this issue and stated that she had started a second job at [employer] and is on her feet a lot. Patient does not recall an actual MOI. Patient states that she has been wearing steel toes boots without any arch support. Patient locates pain to right foot around the arch and in her ankle and describes pain as throbbing in nature. Patient was referred to our office for treatment. Patient states that she has a flat foot so has had pain similar in the past but it is worse now than it has ever had in the past. Pain hurts worse with pressure and wearing a closed toe shoe. Patient states that the best shoe she can wear is her slide, but she knows she should wear something with more support. Patient states today her foot is not as swollen as it has been, but if she walks for too long the foot and toes will swell. Patient has to walk on her tiptoes to avoid the pain but then her toes will start to hurt.    02/21/21 Patient states been non weight bearing because of pain. Right foot. Same pain attempted to walk with inserts. Work note needs to be more specific. Pain on same medial side.  03/12/21 Patient states   Relevant Historical Information: ***  Additional pertinent review of systems negative.   Current Outpatient Medications:    albuterol (VENTOLIN HFA) 108 (90 Base) MCG/ACT inhaler, Inhale 2 puffs into the lungs every 6 (six) hours as needed  for wheezing or shortness of breath., Disp: 1 each, Rfl: 0   cyclobenzaprine (FLEXERIL) 10 MG tablet, Take 1 tablet (10 mg total) by mouth 2 (two) times daily as needed for muscle spasms., Disp: 15 tablet, Rfl: 0   diphenhydrAMINE (BENADRYL) 25 MG tablet, Take 25 mg by mouth every 6 (six) hours as needed for itching or allergies., Disp: , Rfl:    ibuprofen (ADVIL) 800 MG tablet, Take 1 tablet (800 mg total) by mouth 3 (three) times daily., Disp: 21 tablet, Rfl: 0   meloxicam (MOBIC) 15 MG tablet, Take 1 tablet (15 mg total) by mouth daily., Disp: 30 tablet, Rfl: 0   ondansetron (ZOFRAN) 4 MG tablet, Take 1 tablet (4 mg total) by mouth every 8 (eight) hours as needed for nausea or vomiting., Disp: 20 tablet, Rfl: 1   Objective:     There were no vitals filed for this visit.    There is no height or weight on file to calculate BMI.    Physical Exam:    ***   Electronically signed by:  Connie Horton [facility] 4:16 PM 03/11/21

## 2021-03-12 ENCOUNTER — Ambulatory Visit: Payer: Medicaid Other | Admitting: Sports Medicine

## 2021-03-19 DIAGNOSIS — Z20822 Contact with and (suspected) exposure to covid-19: Secondary | ICD-10-CM | POA: Diagnosis not present

## 2021-03-19 DIAGNOSIS — J00 Acute nasopharyngitis [common cold]: Secondary | ICD-10-CM | POA: Diagnosis not present

## 2021-03-19 DIAGNOSIS — Z20828 Contact with and (suspected) exposure to other viral communicable diseases: Secondary | ICD-10-CM | POA: Diagnosis not present

## 2021-03-19 DIAGNOSIS — R059 Cough, unspecified: Secondary | ICD-10-CM | POA: Diagnosis not present

## 2021-03-19 DIAGNOSIS — Z03818 Encounter for observation for suspected exposure to other biological agents ruled out: Secondary | ICD-10-CM | POA: Diagnosis not present

## 2021-03-22 DIAGNOSIS — K122 Cellulitis and abscess of mouth: Secondary | ICD-10-CM | POA: Diagnosis not present

## 2021-03-22 DIAGNOSIS — Z708 Other sex counseling: Secondary | ICD-10-CM | POA: Diagnosis not present

## 2021-03-22 DIAGNOSIS — Z202 Contact with and (suspected) exposure to infections with a predominantly sexual mode of transmission: Secondary | ICD-10-CM | POA: Diagnosis not present

## 2021-03-22 DIAGNOSIS — N898 Other specified noninflammatory disorders of vagina: Secondary | ICD-10-CM | POA: Diagnosis not present

## 2021-04-23 DIAGNOSIS — Z03818 Encounter for observation for suspected exposure to other biological agents ruled out: Secondary | ICD-10-CM | POA: Diagnosis not present

## 2021-04-23 DIAGNOSIS — R509 Fever, unspecified: Secondary | ICD-10-CM | POA: Diagnosis not present

## 2021-04-23 DIAGNOSIS — R110 Nausea: Secondary | ICD-10-CM | POA: Diagnosis not present

## 2021-04-23 DIAGNOSIS — Z20822 Contact with and (suspected) exposure to covid-19: Secondary | ICD-10-CM | POA: Diagnosis not present

## 2021-04-23 DIAGNOSIS — R051 Acute cough: Secondary | ICD-10-CM | POA: Diagnosis not present

## 2021-04-23 DIAGNOSIS — J00 Acute nasopharyngitis [common cold]: Secondary | ICD-10-CM | POA: Diagnosis not present

## 2021-04-26 ENCOUNTER — Encounter: Payer: Self-pay | Admitting: Internal Medicine

## 2021-04-26 ENCOUNTER — Ambulatory Visit: Payer: Medicaid Other | Admitting: Internal Medicine

## 2021-04-26 VITALS — BP 122/82 | HR 99 | Temp 98.8°F | Wt 198.2 lb

## 2021-04-26 DIAGNOSIS — R6889 Other general symptoms and signs: Secondary | ICD-10-CM | POA: Diagnosis not present

## 2021-04-26 LAB — POCT INFLUENZA A/B
Influenza A, POC: NEGATIVE
Influenza B, POC: NEGATIVE

## 2021-04-26 LAB — POC COVID19 BINAXNOW: SARS Coronavirus 2 Ag: NEGATIVE

## 2021-04-26 NOTE — Progress Notes (Signed)
Acute office Visit     This visit occurred during the SARS-CoV-2 public health emergency.  Safety protocols were in place, including screening questions prior to the visit, additional usage of staff PPE, and extensive cleaning of exam room while observing appropriate contact time as indicated for disinfecting solutions.    CC/Reason for Visit: Flulike symptoms  HPI: Connie Horton is a 30 y.o. female who is coming in today for the above mentioned reasons.  7 days ago she started experiencing a severe headache followed quickly by myalgias, chills, nausea vomiting, cough and sore throat.  She did a COVID test at work that was negative.  4 days later, as her symptoms fail to improve she went to a [facility] where a COVID and flu test were again negative.  She was told she had an unspecified viral URI.  She is here today as her symptoms are still the same.  She would like to stay out of work until next week.  Past Medical/Surgical History: Past Medical History:  Diagnosis Date   Asthma     No past surgical history on file.  Social History:  reports that she has never smoked. She has never used smokeless tobacco. She reports current alcohol use. She reports that she does not currently use drugs.  Allergies: Allergies  Allergen Reactions   Citrus Anaphylaxis   Latex Anaphylaxis    Family History:  Family History  Problem Relation Age of Onset   CAD Mother    CVA Mother    CAD Father    CVA Father      Current Outpatient Medications:    albuterol (VENTOLIN HFA) 108 (90 Base) MCG/ACT inhaler, Inhale 2 puffs into the lungs every 6 (six) hours as needed for wheezing or shortness of breath., Disp: 1 each, Rfl: 0   diphenhydrAMINE (BENADRYL) 25 MG tablet, Take 25 mg by mouth every 6 (six) hours as needed for itching or allergies., Disp: , Rfl:    ibuprofen (ADVIL) 800 MG tablet, Take 1 tablet (800 mg total) by mouth 3 (three) times daily., Disp: 21 tablet, Rfl:  0  Review of Systems:  Constitutional: Positive for chills,  appetite change and fatigue.  HEENT: Denies photophobia, eye pain, redness, hearing loss, mouth sores, trouble swallowing, neck pain, neck stiffness and tinnitus.   Respiratory: Denies SOB, DOE,  chest tightness,  and wheezing.   Cardiovascular: Denies chest pain, palpitations and leg swelling.  Gastrointestinal: Denies nausea, vomiting, abdominal pain, diarrhea, constipation, blood in stool and abdominal distention.  Genitourinary: Denies dysuria, urgency, frequency, hematuria, flank pain and difficulty urinating.  Endocrine: Denies: hot or cold intolerance, sweats, changes in hair or nails, polyuria, polydipsia. Musculoskeletal: Denies myalgias, back pain, joint swelling, arthralgias and gait problem.  Skin: Denies pallor, rash and wound.  Neurological: Denies dizziness, seizures, syncope, weakness, light-headedness, numbness and headaches.  Hematological: Denies adenopathy. Easy bruising, personal or family bleeding history  Psychiatric/Behavioral: Denies suicidal ideation, mood changes, confusion, nervousness, sleep disturbance and agitation    Physical Exam: Vitals:   04/26/21 1137  BP: 122/82  Pulse: 99  Temp: 98.8 F (37.1 C)  TempSrc: Oral  SpO2: 100%  Weight: 198 lb 3.2 oz (89.9 kg)    Body mass index is 31.99 kg/m.   Constitutional: NAD, calm, comfortable Eyes: PERRL, lids and conjunctivae normal ENMT: Mucous membranes are moist. Posterior pharynx is erythematous but clear of any exudate or lesions. Normal dentition. Tympanic membrane is pearly white, no erythema or bulging. Respiratory:  clear to auscultation bilaterally, no wheezing, no crackles. Normal respiratory effort. No accessory muscle use.  Cardiovascular: Regular rate and rhythm, no murmurs / rubs / gallops. No extremity edema.  Neurologic: Grossly intact and nonfocal Psychiatric: Normal judgment and insight. Alert and oriented x 3. Normal mood.     Impression and Plan:  Flu-like symptoms  - Plan: POC COVID-19 BinaxNow, POC Influenza A/B -In office flu and COVID tests are again negative. -Given exam findings, PNA, pharyngitis, ear infection are not likely, hence abx have not been prescribed. -Have advised rest, fluids, OTC antihistamines, cough suppressants and mucinex. -RTC if no improvement in 10-14 days. -Work note provided.  Time spent: 21 minutes reviewing chart, interviewing and examining patient and formulating plan of care.     Chaya Jan, MD [facility]

## 2021-05-14 DIAGNOSIS — Z202 Contact with and (suspected) exposure to infections with a predominantly sexual mode of transmission: Secondary | ICD-10-CM | POA: Diagnosis not present

## 2021-05-14 DIAGNOSIS — N760 Acute vaginitis: Secondary | ICD-10-CM | POA: Diagnosis not present

## 2021-05-14 DIAGNOSIS — R350 Frequency of micturition: Secondary | ICD-10-CM | POA: Diagnosis not present

## 2021-05-14 DIAGNOSIS — N3000 Acute cystitis without hematuria: Secondary | ICD-10-CM | POA: Diagnosis not present

## 2021-09-03 DIAGNOSIS — Z708 Other sex counseling: Secondary | ICD-10-CM | POA: Diagnosis not present

## 2021-09-03 DIAGNOSIS — N760 Acute vaginitis: Secondary | ICD-10-CM | POA: Diagnosis not present

## 2021-09-03 DIAGNOSIS — Z202 Contact with and (suspected) exposure to infections with a predominantly sexual mode of transmission: Secondary | ICD-10-CM | POA: Diagnosis not present

## 2021-10-09 ENCOUNTER — Encounter: Payer: Self-pay | Admitting: Internal Medicine

## 2021-10-09 ENCOUNTER — Ambulatory Visit: Payer: Medicaid Other | Admitting: Internal Medicine

## 2021-10-09 DIAGNOSIS — I10 Essential (primary) hypertension: Secondary | ICD-10-CM | POA: Insufficient documentation

## 2021-10-09 LAB — CBC WITH DIFFERENTIAL/PLATELET
Basophils Absolute: 0 10*3/uL (ref 0.0–0.1)
Basophils Relative: 0.2 % (ref 0.0–3.0)
Eosinophils Absolute: 0.1 10*3/uL (ref 0.0–0.7)
Eosinophils Relative: 1.8 % (ref 0.0–5.0)
HCT: 38.3 % (ref 36.0–46.0)
Hemoglobin: 12.9 g/dL (ref 12.0–15.0)
Lymphocytes Relative: 31.6 % (ref 12.0–46.0)
Lymphs Abs: 2.4 10*3/uL (ref 0.7–4.0)
MCHC: 33.7 g/dL (ref 30.0–36.0)
MCV: 84 fl (ref 78.0–100.0)
Monocytes Absolute: 0.6 10*3/uL (ref 0.1–1.0)
Monocytes Relative: 8.3 % (ref 3.0–12.0)
Neutro Abs: 4.4 10*3/uL (ref 1.4–7.7)
Neutrophils Relative %: 58.1 % (ref 43.0–77.0)
Platelets: 336 10*3/uL (ref 150.0–400.0)
RBC: 4.56 Mil/uL (ref 3.87–5.11)
RDW: 13.2 % (ref 11.5–15.5)
WBC: 7.7 10*3/uL (ref 4.0–10.5)

## 2021-10-09 LAB — LIPID PANEL
Cholesterol: 169 mg/dL (ref 0–200)
HDL: 52.1 mg/dL (ref >39.00)
LDL Cholesterol: 102 mg/dL — ABNORMAL HIGH (ref 0–99)
NonHDL: 116.7
Total CHOL/HDL Ratio: 3
Triglycerides: 76 mg/dL (ref 0.0–149.0)
VLDL: 15.2 mg/dL (ref 0.0–40.0)

## 2021-10-09 LAB — COMPREHENSIVE METABOLIC PANEL
ALT: 34 U/L (ref 0–35)
AST: 33 U/L (ref 0–37)
Albumin: 4 g/dL (ref 3.5–5.2)
Alkaline Phosphatase: 79 U/L (ref 39–117)
BUN: 12 mg/dL (ref 6–23)
CO2: 24 mEq/L (ref 19–32)
Calcium: 9.6 mg/dL (ref 8.4–10.5)
Chloride: 106 mEq/L (ref 96–112)
Creatinine, Ser: 0.64 mg/dL (ref 0.40–1.20)
GFR: 117.49 mL/min (ref >60.00)
Glucose, Bld: 88 mg/dL (ref 70–99)
Potassium: 4 mEq/L (ref 3.5–5.1)
Sodium: 138 mEq/L (ref 135–145)
Total Bilirubin: 0.4 mg/dL (ref 0.2–1.2)
Total Protein: 7.6 g/dL (ref 6.0–8.3)

## 2021-10-09 LAB — HEMOGLOBIN A1C: Hgb A1c MFr Bld: 5.9 % (ref 4.6–6.5)

## 2021-10-09 MED ORDER — AMLODIPINE BESYLATE 5 MG PO TABS: 5.0000 mg | ORAL_TABLET | Freq: Every day | ORAL | 1 refills | Status: DC

## 2021-10-09 NOTE — Patient Instructions (Signed)
-  Nice seeing you today!!  -Lab work today; will notify you once results are available.  -Start amlodipine 5 mg daily.  -Check BP at home 3-4 times a week and bring measurements into your next visit.

## 2021-10-09 NOTE — Progress Notes (Signed)
Established Patient Office Visit     CC/Reason for Visit: Headache, elevated blood pressure  HPI: Connie Horton is a 31 y.o. female who is coming in today for the above mentioned reasons.  She has no past medical history of significance.  She has been noted to have elevated blood pressure in office in the past and was asked to do ambulatory measurements but never returned.  She has not been checking her blood pressure at home.  She became concerned when she developed a headache 5 days ago that she has had a hard time getting rid of.  She took her mother to her doctor's office yesterday and her mother was started on high blood pressure medication.  She comes in today for evaluation.  2 in office blood pressure measurements are 150/80 and 140/90.  Past Medical/Surgical History: Past Medical History:  Diagnosis Date   Asthma     No past surgical history on file.  Social History:  reports that she has never smoked. She has never used smokeless tobacco. She reports current alcohol use. She reports that she does not currently use drugs.  Allergies: Allergies  Allergen Reactions   Citrus Anaphylaxis   Latex Anaphylaxis    Family History:  Family History  Problem Relation Age of Onset   CAD Mother    CVA Mother    CAD Father    CVA Father      Current Outpatient Medications:    albuterol (VENTOLIN HFA) 108 (90 Base) MCG/ACT inhaler, Inhale 2 puffs into the lungs every 6 (six) hours as needed for wheezing or shortness of breath., Disp: 1 each, Rfl: 0   amLODipine (NORVASC) 5 MG tablet, Take 1 tablet (5 mg total) by mouth daily., Disp: 90 tablet, Rfl: 1   diphenhydrAMINE (BENADRYL) 25 MG tablet, Take 25 mg by mouth every 6 (six) hours as needed for itching or allergies., Disp: , Rfl:   Review of Systems:  Constitutional: Denies fever, chills, diaphoresis, appetite change and fatigue.  HEENT: Denies photophobia, eye pain, redness, hearing loss, ear pain, congestion, sore  throat, rhinorrhea, sneezing, mouth sores, trouble swallowing, neck pain, neck stiffness and tinnitus.   Respiratory: Denies SOB, DOE, cough, chest tightness,  and wheezing.   Cardiovascular: Denies chest pain, palpitations and leg swelling.  Gastrointestinal: Denies nausea, vomiting, abdominal pain, diarrhea, constipation, blood in stool and abdominal distention.  Genitourinary: Denies dysuria, urgency, frequency, hematuria, flank pain and difficulty urinating.  Endocrine: Denies: hot or cold intolerance, sweats, changes in hair or nails, polyuria, polydipsia. Musculoskeletal: Denies myalgias, back pain, joint swelling, arthralgias and gait problem.  Skin: Denies pallor, rash and wound.  Neurological: Denies dizziness, seizures, syncope, weakness, light-headedness, numbness.  Hematological: Denies adenopathy. Easy bruising, personal or family bleeding history  Psychiatric/Behavioral: Denies suicidal ideation, mood changes, confusion, nervousness, sleep disturbance and agitation    Physical Exam: Vitals:   10/09/21 1433 10/09/21 1437  BP: (!) 150/90 140/80  Pulse: 91   Temp: 98.6 F (37 C)   TempSrc: Oral   SpO2: 98%   Weight: 189 lb 12.8 oz (86.1 kg)     Body mass index is 30.63 kg/m.   Constitutional: NAD, calm, comfortable Eyes: PERRL, lids and conjunctivae normal ENMT: Mucous membranes are moist.  Respiratory: clear to auscultation bilaterally, no wheezing, no crackles. Normal respiratory effort. No accessory muscle use.  Cardiovascular: Regular rate and rhythm, no murmurs / rubs / gallops. No extremity edema. Neurologic: Grossly intact and nonfocal Psychiatric: Normal judgment and insight.  Alert and oriented x 3. Normal mood.    Impression and Plan:  Primary hypertension - Plan: amLODipine (NORVASC) 5 MG tablet, CBC with Differential/Platelet, Comprehensive metabolic panel, Hemoglobin A1c, Lipid panel -With elevated blood pressure today on 2 separate measurements and on  previous office visits, I will go ahead and make diagnosis of hypertension. -Start amlodipine 5 mg daily, do ambulatory measurements and return in 6 to 8 weeks for follow-up.   Time spent:32 minutes reviewing chart, interviewing and examining patient and formulating plan of care.   Patient Instructions  -Nice seeing you today!!  -Lab work today; will notify you once results are available.  -Start amlodipine 5 mg daily.  -Check BP at home 3-4 times a week and bring measurements into your next visit.      Lelon Frohlich, MD [facility]

## 2021-11-11 ENCOUNTER — Ambulatory Visit
Admission: EM | Admit: 2021-11-11 | Discharge: 2021-11-11 | Disposition: A | Discharge status: Home / Self Care | Payer: Medicaid Other

## 2021-11-11 ENCOUNTER — Emergency Department (HOSPITAL_COMMUNITY)
Admission: EM | Admit: 2021-11-11 | Discharge: 2021-11-11 | Disposition: A | Discharge status: Home / Self Care | Payer: Medicaid Other | Attending: Emergency Medicine | Admitting: Emergency Medicine

## 2021-11-11 ENCOUNTER — Encounter (HOSPITAL_COMMUNITY): Payer: Self-pay

## 2021-11-11 DIAGNOSIS — Z79899 Other long term (current) drug therapy: Secondary | ICD-10-CM | POA: Diagnosis not present

## 2021-11-11 DIAGNOSIS — I10 Essential (primary) hypertension: Secondary | ICD-10-CM | POA: Diagnosis not present

## 2021-11-11 DIAGNOSIS — R55 Syncope and collapse: Secondary | ICD-10-CM | POA: Insufficient documentation

## 2021-11-11 DIAGNOSIS — R4020 Unspecified coma: Secondary | ICD-10-CM

## 2021-11-11 DIAGNOSIS — Z91040 Latex allergy status: Secondary | ICD-10-CM | POA: Insufficient documentation

## 2021-11-11 DIAGNOSIS — R519 Headache, unspecified: Secondary | ICD-10-CM

## 2021-11-11 DIAGNOSIS — F419 Anxiety disorder, unspecified: Secondary | ICD-10-CM | POA: Diagnosis not present

## 2021-11-11 LAB — CBC
HCT: 42.7 % (ref 36.0–46.0)
Hemoglobin: 13.7 g/dL (ref 12.0–15.0)
MCH: 27.6 pg (ref 26.0–34.0)
MCHC: 32.1 g/dL (ref 30.0–36.0)
MCV: 86.1 fL (ref 80.0–100.0)
Platelets: 338 10*3/uL (ref 150–400)
RBC: 4.96 MIL/uL (ref 3.87–5.11)
RDW: 12.9 % (ref 11.5–15.5)
WBC: 8 10*3/uL (ref 4.0–10.5)
nRBC: 0 % (ref 0.0–0.2)

## 2021-11-11 LAB — BASIC METABOLIC PANEL
Anion gap: 6 (ref 5–15)
BUN: 12 mg/dL (ref 6–20)
CO2: 25 mmol/L (ref 22–32)
Calcium: 9.3 mg/dL (ref 8.9–10.3)
Chloride: 108 mmol/L (ref 98–111)
Creatinine, Ser: 0.59 mg/dL (ref 0.44–1.00)
GFR, Estimated: >60 mL/min (ref >60)
Glucose, Bld: 112 mg/dL — ABNORMAL HIGH (ref 70–99)
Potassium: 4.3 mmol/L (ref 3.5–5.1)
Sodium: 139 mmol/L (ref 135–145)

## 2021-11-11 LAB — URINALYSIS, ROUTINE W REFLEX MICROSCOPIC
Bacteria, UA: NONE SEEN
Bilirubin Urine: NEGATIVE
Glucose, UA: NEGATIVE mg/dL
Hgb urine dipstick: NEGATIVE
Ketones, ur: NEGATIVE mg/dL
Nitrite: NEGATIVE
Protein, ur: NEGATIVE mg/dL
Specific Gravity, Urine: 1.02 (ref 1.005–1.030)
pH: 5 (ref 5.0–8.0)

## 2021-11-11 LAB — I-STAT BETA HCG BLOOD, ED (MC, WL, AP ONLY): I-stat hCG, quantitative: <5 m[IU]/mL (ref <5)

## 2021-11-11 LAB — CBG MONITORING, ED: Glucose-Capillary: 123 mg/dL — ABNORMAL HIGH (ref 70–99)

## 2021-11-11 NOTE — ED Triage Notes (Signed)
Pt arrived via POV, states multiple syncopal events throughout the week. Was seen at [facility] today and sent to ED for further work up. States generalized weakness and headache.

## 2021-11-11 NOTE — ED Provider Notes (Signed)
[facility]-EMERGENCY DEPT Provider Note   CSN: [id number] Arrival date & time: 11/11/21  1007     History  Chief Complaint  Patient presents with   Loss of Consciousness    Connie Horton is a 31 y.o. female.  31 year old female with prior medical history as detailed below presents for evaluation.  Patient is visibly anxious during evaluation.  She reports that she was diagnosed with hypertension approximately 2 weeks ago.  She was started on amlodipine.  She reports that she has taken this intermittently.  She feels like the amlodipine makes her feel sleepy.  She reports intermittent headache for the last 2 weeks.  She feels that her headache may be secondary to her elevated blood pressure.  She also complains of 2 episodes of " passing out."  Patient reports that yesterday she apparently had some type of " passing out episode" while she was having an argument with her boyfriend.  She cannot recall the events.  She reports that she was arguing and then she was on the bed with her boyfriend trying to wake her up.  She denies associated chest pain, nausea, vomiting, palpitations.  She denies current headache.   Patient is accompanied by her mother today.  The mother reports that last week she also had a brief "passing out episode."  This occurred when the patient was at home with her mother.  The mother denies any seizure-like activity.  Patient's episodes of syncope or near syncope appear to be associated with stressful events.    The history is provided by the patient and medical records.       Home Medications Prior to Admission medications   Medication Sig Start Date End Date Taking? Authorizing Provider  albuterol (VENTOLIN HFA) 108 (90 Base) MCG/ACT inhaler Inhale 2 puffs into the lungs every 6 (six) hours as needed for wheezing or shortness of breath. 01/04/21   Nafziger, Kandee Keen, NP  amLODipine (NORVASC) 5 MG tablet Take 1 tablet (5 mg total) by mouth  daily. 10/09/21   Philip Aspen, Limmie Patricia, MD  diphenhydrAMINE (BENADRYL) 25 MG tablet Take 25 mg by mouth every 6 (six) hours as needed for itching or allergies.    [provider]      Allergies    Citrus, Latex, and Amoxicillin    Review of Systems   Review of Systems  All other systems reviewed and are negative.   Physical Exam Updated Vital Signs BP (!) 158/106 (BP Location: Right Arm)   Pulse 98   Temp 98.7 F (37.1 C) (Oral)   Resp 18   LMP 11/02/2021   SpO2 100%  Physical Exam Vitals and nursing note reviewed.  Constitutional:      General: She is not in acute distress.    Appearance: Normal appearance. She is well-developed.  HENT:     Head: Normocephalic and atraumatic.  Eyes:     Conjunctiva/sclera: Conjunctivae normal.     Pupils: Pupils are equal, round, and reactive to light.  Cardiovascular:     Rate and Rhythm: Normal rate and regular rhythm.     Heart sounds: Normal heart sounds.  Pulmonary:     Effort: Pulmonary effort is normal. No respiratory distress.     Breath sounds: Normal breath sounds.  Abdominal:     General: There is no distension.     Palpations: Abdomen is soft.     Tenderness: There is no abdominal tenderness.  Musculoskeletal:        General:  No deformity. Normal range of motion.     Cervical back: Normal range of motion and neck supple.  Skin:    General: Skin is warm and dry.  Neurological:     General: No focal deficit present.     Mental Status: She is alert and oriented to person, place, and time. Mental status is at baseline.     Cranial Nerves: No cranial nerve deficit.     Sensory: No sensory deficit.     Motor: No weakness.     Coordination: Coordination normal.     Gait: Gait normal.     ED Results / Procedures / Treatments   Labs (all labs ordered are listed, but only abnormal results are displayed) Labs Reviewed  BASIC METABOLIC PANEL - Abnormal; Notable for the following components:      Result Value    Glucose, Bld 112 (*)    All other components within normal limits  URINALYSIS, ROUTINE W REFLEX MICROSCOPIC - Abnormal; Notable for the following components:   APPearance CLOUDY (*)    Leukocytes,Ua MODERATE (*)    All other components within normal limits  CBC  I-STAT BETA HCG BLOOD, ED ([facility], [facility], [facility] ONLY)  CBG MONITORING, ED    EKG EKG Interpretation  Date/Time:  Monday November 11 2021 10:12:36 EDT Ventricular Rate:  104 PR Interval:  163 QRS Duration: 69 QT Interval:  332 QTC Calculation: 437 R Axis:   82 Text Interpretation: Sinus tachycardia No old tracing to compare Confirmed by Mancel Bale [phone]) on 11/11/2021 11:26:04 AM  Radiology No results found.  Procedures Procedures    Medications Ordered in ED Medications - No data to display  ED Course/ Medical Decision Making/ A&P                           Medical Decision Making Amount and/or Complexity of Data Reviewed Labs: ordered.    Medical Screen Complete  This patient presented to the ED with complaint of headache, poorly controlled hypertension, anxiety..  This complaint involves an extensive number of treatment options. The initial differential diagnosis includes, but is not limited to, metabolic abnormality, etc.  This presentation is: Acute, Self-Limited, Previously Undiagnosed, Uncertain Prognosis, Complicated, Systemic Symptoms, and Threat to Life/Bodily Function  Patient is presenting with apparent poorly controlled hypertension.  Patient's presentation is not consistent with significant acute pathology.  Screening labs obtained are without significant abnormality.  Patient's blood pressure here is mildly elevated.  Patient is advised to closely follow-up with her PCP tomorrow for further discussions about how she can control her BP in the outpatient setting.  Importance of close follow-up stressed.  Strict return precautions given and understood.  Co morbidities that complicated the  patient's evaluation  Poorly controlled hypertension, anxiety   Additional history obtained:  Additional history obtained from [facility] External records from outside sources obtained and reviewed including prior ED visits and prior Inpatient records.    Lab Tests:  I ordered and personally interpreted labs.  The pertinent results include: CBC, BMP, CBG, hCG, UA   Cardiac Monitoring:  The patient was maintained on a cardiac monitor.  I personally viewed and interpreted the cardiac monitor which showed an underlying rhythm of: NSR Problem List / ED Course:  Poorly controlled hypertension   Reevaluation:  After the interventions noted above, I reevaluated the patient and found that they have: improved   Disposition:  After consideration of the diagnostic results and the patients response  to treatment, I feel that the patent would benefit from close outpatient follow up.          Final Clinical Impression(s) / ED Diagnoses Final diagnoses:  Hypertension, unspecified type    Rx / DC Orders ED Discharge Orders     None         Valarie Merino, MD 11/11/21 1352

## 2021-11-11 NOTE — ED Notes (Signed)
Patient has a blue top in the main lab °

## 2021-11-11 NOTE — Discharge Instructions (Signed)
Return for any problem.  ?

## 2021-11-11 NOTE — ED Provider Triage Note (Signed)
Emergency Medicine Provider Triage Evaluation Note  Connie Horton , a 31 y.o. female  was evaluated in triage.  Pt complains of 2 syncopal episodes over the past week and a headache.  Patient was evaluated in urgent care this morning who sent her to the emergency department for further evaluation.  Patient states that there have been 2 witnessed syncopal episodes over the past week.  Approximately 1 month ago the patient was having headaches which were gradual in onset and not the worst of her life.  She was placed on amlodipine due to high blood pressure at the time.  Patient states that over the past week she started a new job and did not tolerate amlodipine so quit taking it.  She believes she may have missed 3 days of amlodipine.  Her first syncopal episode was apparently a week ago with an additional syncopal episode yesterday around 2 PM.  There was no reported incontinence.  No head injury.  Review of Systems  Positive: Headache, syncope Negative: Shortness of breath  Physical Exam  BP (!) 158/106 (BP Location: Right Arm)   Pulse 98   Temp 98.7 F (37.1 C) (Oral)   Resp 18   LMP 11/02/2021   SpO2 100%  Gen:   Awake, no distress   Resp:  Normal effort  MSK:   Moves extremities without difficulty  Other:    Medical Decision Making  Medically screening exam initiated at 10:39 AM.  Appropriate orders placed.  Connie Horton was informed that the remainder of the evaluation will be completed by another provider, this initial triage assessment does not replace that evaluation, and the importance of remaining in the ED until their evaluation is complete.     Darrick Grinder, PA-C 11/11/21 1045

## 2021-11-11 NOTE — ED Triage Notes (Addendum)
Pt states she was dx with HTN a month ago, she was placed on amlodipine. The patient states she has been having headaches and has skipped a few doses of the amlodipine. She states yesterday around 1400 she pasted out (per her significant other). The patient states when she gained consciousness she was not aware of the episode. The patient states she does not feel like herself. The patient is A&Ox4 and ambulatory. The patient states she noticed a rash so she did take benadryl around 2300 last night.

## 2021-11-11 NOTE — ED Notes (Signed)
Patient is being discharged from the Urgent Care and sent to the Emergency Department via POA. Per L. Morgan-Scales PA-C , patient is in need of higher level of care due to Need for further evaluation . Patient is aware and verbalizes understanding of plan of care.  Vitals:   11/11/21 0917  BP: 129/83  Pulse: 90  Resp: 16  Temp: (!) 97.5 F (36.4 C)  SpO2: 98%

## 2021-11-11 NOTE — ED Provider Notes (Addendum)
Pt is a 31 year old African-American female who states she was dx with HTN a month ago, she was placed on amlodipine.  Patient states she went to see her primary care provider when she noticed that she was having frequent, severe headaches.  Patient states she recently started a new job and because the amlodipine makes her feel sleepy when she takes it, skipped a few doses.  Patient states she continues to have intermittent, severe headaches, states she has a headache at this time.  Patient is with her mother today who states that she passed out a week ago and fell on the floor.  Patient states that she also passed out again yesterday around 2:00 while she was lying on her bed.  Patient states her boyfriend witnessed her passing out, states she did not lose control of urine, patient states she was unaware that she passed out.    Patient is ambulatory on arrival today and alert and oriented x4, blood pressure on arrival is also good, patient states she did take her amlodipine.  Patient states overall, she has not felt like herself.  Patient denies history of seizure disorder.  Patient advised that I recommend further evaluation of her 2 episodes of loss of consciousness.  Mother agrees to take her to the emergency room now for further evaluation.   Theadora Rama Scales, PA-C 11/11/21 2355    Theadora Rama Scales, PA-C 11/11/21 0930

## 2021-11-12 ENCOUNTER — Ambulatory Visit: Payer: Medicaid Other | Admitting: Adult Health

## 2021-11-12 VITALS — BP 110/80 | HR 92 | Temp 99.2°F | Ht 66.0 in | Wt 183.0 lb

## 2021-11-12 DIAGNOSIS — I10 Essential (primary) hypertension: Secondary | ICD-10-CM | POA: Diagnosis not present

## 2021-11-12 DIAGNOSIS — R55 Syncope and collapse: Secondary | ICD-10-CM

## 2021-11-12 MED ORDER — AMLODIPINE BESYLATE 2.5 MG PO TABS: 2.5000 mg | ORAL_TABLET | Freq: Every day | ORAL | 0 refills | Status: DC

## 2021-11-12 NOTE — Progress Notes (Signed)
Subjective:    Patient ID: Connie Horton, female    DOB: [birth year], 31 y.o.   MRN: [id number]  HPI  31 year old female who  has a past medical history of Asthma.  She is a patient of Dr. Ardyth Harps who I am seeing today for follow up after being seen in the ER yesterday evening.  She was diagnosed with hypertension by her PCP on 10/09/2021 and was started on Norvasc 5 mg daily.  When she was seen in the emergency room she reported that she was taking this medication intermittently as it was causing her to feel tired and fall asleep about 45 minutes after taking it.   Yesterday she reportedly had some type of "passing out episode" while she was having an argument with her boyfriend.  She has a hard time remembering events.  In the office today she reports "I was standing next to the bed and the next thing I know I am waking up on the bed and my boyfriend is crying".  She also reports that roughly a week ago she had a brief "passing out episode" that happened at home with her mother.  Her mother was in the emergency room with her and denied any seizure-like activity.  She has restarted her Norvasc  Review of Systems See HPI   Past Medical History:  Diagnosis Date   Asthma     Social History   Socioeconomic History   Marital status: Significant Other    Spouse name: Not on file   Number of children: Not on file   Years of education: Not on file   Highest education level: 12th grade  Occupational History   Not on file  Tobacco Use   Smoking status: Never   Smokeless tobacco: Never  Vaping Use   Vaping Use: Every day  Substance and Sexual Activity   Alcohol use: Yes    Comment: occassional   Drug use: Not Currently   Sexual activity: Yes  Other Topics Concern   Not on file  Social History Narrative   Not on file   Social Determinants of Health   Financial Resource Strain: Low Risk  (10/08/2021)   Overall Financial Resource Strain (CARDIA)    Difficulty of Paying Living  Expenses: Not hard at all  Food Insecurity: No Food Insecurity (10/08/2021)   Hunger Vital Sign    Worried About Running Out of Food in the Last Year: Never true    Ran Out of Food in the Last Year: Never true  Transportation Needs: No Transportation Needs (10/08/2021)   PRAPARE - Administrator, Civil Service (Medical): No    Lack of Transportation (Non-Medical): No  Physical Activity: Insufficiently Active (10/08/2021)   Exercise Vital Sign    Days of Exercise per Week: 1 day    Minutes of Exercise per Session: 30 min  Stress: No Stress Concern Present (10/08/2021)   [employer] of Occupational Health - Occupational Stress Questionnaire    Feeling of Stress : Only a little  Social Connections: Socially Isolated (10/08/2021)   Social Connection and Isolation Panel [NHANES]    Frequency of Communication with Friends and Family: Once a week    Frequency of Social Gatherings with Friends and Family: Never    Attends Religious Services: Never    Database administrator or Organizations: No    Attends Engineer, structural: Not on file    Marital Status: Never married  Intimate Partner Violence: Not on  file    No past surgical history on file.  Family History  Problem Relation Age of Onset   CAD Mother    CVA Mother    CAD Father    CVA Father     Allergies  Allergen Reactions   Citrus Anaphylaxis   Latex Anaphylaxis   Amoxicillin Hives and Itching    Current Outpatient Medications on File Prior to Visit  Medication Sig Dispense Refill   albuterol (VENTOLIN HFA) 108 (90 Base) MCG/ACT inhaler Inhale 2 puffs into the lungs every 6 (six) hours as needed for wheezing or shortness of breath. 1 each 0   diphenhydrAMINE (BENADRYL) 25 MG tablet Take 25 mg by mouth every 6 (six) hours as needed for itching or allergies.     No current facility-administered medications on file prior to visit.    BP 110/80   Pulse 92   Temp 99.2 F (37.3 C) (Oral)   Ht 5\' 6"   (1.676 m)   Wt 183 lb (83 kg)   LMP 11/02/2021   SpO2 98%   BMI 29.54 kg/m       Objective:   Physical Exam Vitals and nursing note reviewed.  Constitutional:      Appearance: Normal appearance.  Cardiovascular:     Rate and Rhythm: Normal rate and regular rhythm.     Pulses: Normal pulses.     Heart sounds: Normal heart sounds.  Pulmonary:     Effort: Pulmonary effort is normal.     Breath sounds: Normal breath sounds.  Musculoskeletal:        General: Normal range of motion.  Skin:    General: Skin is warm and dry.     Capillary Refill: Capillary refill takes less than 2 seconds.  Neurological:     General: No focal deficit present.     Mental Status: She is alert and oriented to person, place, and time.  Psychiatric:        Mood and Affect: Mood normal.        Behavior: Behavior normal.        Thought Content: Thought content normal.        Judgment: Judgment normal.       Assessment & Plan:  1. Primary hypertension -Blood pressure 110/80 in the office today.  We will decrease her Norvasc to 2.5 mg daily, she can take this at night before bed if it makes her sleepy.  Advise follow-up with her PCP in 2 weeks and to start monitoring her blood pressure at home. - amLODipine (NORVASC) 2.5 MG tablet; Take 1 tablet (2.5 mg total) by mouth daily.  Dispense: 30 tablet; Refill: 0  2. Syncope, unspecified syncope type -Stress-induced versus vasovagal versus orthostatic hypotension.  We will decrease her Norvasc to 2.5 mg.  Encouraged to stay hydrated.  Start working on stress relief.  Follow-up with PCP in 2 weeks or sooner if syncopal episode happens again  01/03/2022, NP

## 2021-11-15 ENCOUNTER — Encounter: Payer: Self-pay | Admitting: Internal Medicine

## 2021-11-24 DIAGNOSIS — N760 Acute vaginitis: Secondary | ICD-10-CM | POA: Diagnosis not present

## 2021-11-24 DIAGNOSIS — Z6828 Body mass index (BMI) 28.0-28.9, adult: Secondary | ICD-10-CM | POA: Diagnosis not present

## 2021-11-24 DIAGNOSIS — N3000 Acute cystitis without hematuria: Secondary | ICD-10-CM | POA: Diagnosis not present

## 2021-12-02 ENCOUNTER — Ambulatory Visit: Payer: Medicaid Other | Admitting: Internal Medicine

## 2021-12-02 VITALS — BP 120/70 | HR 99 | Temp 98.2°F | Wt 186.4 lb

## 2021-12-02 DIAGNOSIS — R519 Headache, unspecified: Secondary | ICD-10-CM | POA: Diagnosis not present

## 2021-12-02 DIAGNOSIS — I10 Essential (primary) hypertension: Secondary | ICD-10-CM

## 2021-12-02 NOTE — Progress Notes (Signed)
Established Patient Office Visit     CC/Reason for Visit: Follow-up blood pressure, discuss FMLA forms  HPI: Connie Horton is a 31 y.o. female who is coming in today for the above mentioned reasons. Past Medical History is significant for: Recently discussed hypertension started on amlodipine 5 mg.  It appears that she had a couple of syncopal episodes but had a negative work-up in the emergency department.  She was seen in clinic in follow-up and amlodipine was reduced to 2.5 mg despite no episodes of hypotension.  She was asked to follow-up with me today.  In the interim I received a couple of MyChart messages regarding elevated blood pressure so 2 weeks ago she was advised to increase amlodipine again to 5 mg.  Blood pressure has normalized.  Headaches have improved.  She is requesting FMLA forms for time that she was out of work.   Past Medical/Surgical History: Past Medical History:  Diagnosis Date   Asthma     No past surgical history on file.  Social History:  reports that she has never smoked. She has never used smokeless tobacco. She reports current alcohol use. She reports that she does not currently use drugs.  Allergies: Allergies  Allergen Reactions   Citrus Anaphylaxis   Latex Anaphylaxis   Amoxicillin Hives and Itching    Family History:  Family History  Problem Relation Age of Onset   CAD Mother    CVA Mother    CAD Father    CVA Father      Current Outpatient Medications:    albuterol (VENTOLIN HFA) 108 (90 Base) MCG/ACT inhaler, Inhale 2 puffs into the lungs every 6 (six) hours as needed for wheezing or shortness of breath., Disp: 1 each, Rfl: 0   amLODipine (NORVASC) 2.5 MG tablet, Take 1 tablet (2.5 mg total) by mouth daily. (Patient taking differently: Take 5 mg by mouth daily.), Disp: 30 tablet, Rfl: 0   diphenhydrAMINE (BENADRYL) 25 MG tablet, Take 25 mg by mouth every 6 (six) hours as needed for itching or allergies., Disp: , Rfl:   Review  of Systems:  Constitutional: Denies fever, chills, diaphoresis, appetite change and fatigue.  HEENT: Denies photophobia, eye pain, redness, hearing loss, ear pain, congestion, sore throat, rhinorrhea, sneezing, mouth sores, trouble swallowing, neck pain, neck stiffness and tinnitus.   Respiratory: Denies SOB, DOE, cough, chest tightness,  and wheezing.   Cardiovascular: Denies chest pain, palpitations and leg swelling.  Gastrointestinal: Denies nausea, vomiting, abdominal pain, diarrhea, constipation, blood in stool and abdominal distention.  Genitourinary: Denies dysuria, urgency, frequency, hematuria, flank pain and difficulty urinating.  Endocrine: Denies: hot or cold intolerance, sweats, changes in hair or nails, polyuria, polydipsia. Musculoskeletal: Denies myalgias, back pain, joint swelling, arthralgias and gait problem.  Skin: Denies pallor, rash and wound.  Neurological: Denies dizziness, seizures, syncope, weakness, light-headedness, numbness and headaches.  Hematological: Denies adenopathy. Easy bruising, personal or family bleeding history  Psychiatric/Behavioral: Denies suicidal ideation, mood changes, confusion, nervousness, sleep disturbance and agitation    Physical Exam: Vitals:   12/02/21 1111  BP: 120/70  Pulse: 99  Temp: 98.2 F (36.8 C)  TempSrc: Oral  SpO2: 97%  Weight: 186 lb 6.4 oz (84.6 kg)    Body mass index is 30.09 kg/m.   Constitutional: NAD, calm, comfortable Eyes: PERRL, lids and conjunctivae normal, wears corrective lenses ENMT: Mucous membranes are moist.  Respiratory: clear to auscultation bilaterally, no wheezing, no crackles. Normal respiratory effort. No accessory muscle use.  Cardiovascular: Regular rate and rhythm, no murmurs / rubs / gallops. No extremity edema. Psychiatric: Normal judgment and insight. Alert and oriented x 3. Normal mood.    Impression and Plan:  Primary hypertension  Nonintractable headache, unspecified chronicity  pattern, unspecified headache type  -Blood pressure appears well controlled on amlodipine 5 mg daily. -FMLA forms have been filled out, copy will be placed in chart.  Time spent:32 minutes reviewing chart, interviewing and examining patient and formulating plan of care.    Chaya Jan, MD [facility]

## 2021-12-02 NOTE — Progress Notes (Signed)
Pt given copy of completed paperwork; states she will email it where it needs to go. Copy sent to scan; original with CMA.

## 2021-12-24 ENCOUNTER — Telehealth: Payer: Self-pay | Admitting: Sports Medicine

## 2021-12-24 ENCOUNTER — Encounter: Payer: Self-pay | Admitting: Sports Medicine

## 2021-12-24 NOTE — Telephone Encounter (Signed)
Letter was written and sent to patient via mychart

## 2021-12-24 NOTE — Telephone Encounter (Signed)
Patient called stating that it has been a while since she has been seen but she is starting a new job and if she has seen a specialist in the last 3 years, they are requesting a letter stating that she is able to work with no restrictions.  She asked if Dr Jean Rosenthal could write a letter for her? She said that she has not had any further issues with her foot and her knee.  Please advise.

## 2022-01-07 ENCOUNTER — Encounter: Payer: Self-pay | Admitting: Internal Medicine

## 2022-01-07 ENCOUNTER — Ambulatory Visit: Payer: Medicaid Other | Admitting: Internal Medicine

## 2022-01-07 VITALS — BP 120/76 | HR 88 | Temp 97.9°F | Wt 186.3 lb

## 2022-01-07 DIAGNOSIS — R5383 Other fatigue: Secondary | ICD-10-CM | POA: Diagnosis not present

## 2022-01-07 DIAGNOSIS — R6883 Chills (without fever): Secondary | ICD-10-CM | POA: Diagnosis not present

## 2022-01-07 DIAGNOSIS — J029 Acute pharyngitis, unspecified: Secondary | ICD-10-CM | POA: Diagnosis not present

## 2022-01-07 LAB — POCT INFLUENZA A/B
Influenza A, POC: NEGATIVE
Influenza B, POC: NEGATIVE

## 2022-01-07 LAB — POCT RAPID STREP A (OFFICE): Rapid Strep A Screen: NEGATIVE

## 2022-01-07 LAB — POC COVID19 BINAXNOW: SARS Coronavirus 2 Ag: NEGATIVE

## 2022-01-07 NOTE — Progress Notes (Signed)
Established Patient Office Visit     CC/Reason for Visit: Flu-like symptoms  HPI: Connie Horton is a 31 y.o. female who is coming in today for the above mentioned reasons. She woke up at 1 am with severe sore throat, chills and a HA. She took some theraflu. No fever. No sick contacts.  Past Medical/Surgical History: Past Medical History:  Diagnosis Date   Asthma     No past surgical history on file.  Social History:  reports that she has never smoked. She has never used smokeless tobacco. She reports current alcohol use. She reports that she does not currently use drugs.  Allergies: Allergies  Allergen Reactions   Citrus Anaphylaxis   Latex Anaphylaxis   Amoxicillin Hives and Itching    Family History:  Family History  Problem Relation Age of Onset   CAD Mother    CVA Mother    CAD Father    CVA Father      Current Outpatient Medications:    albuterol (VENTOLIN HFA) 108 (90 Base) MCG/ACT inhaler, Inhale 2 puffs into the lungs every 6 (six) hours as needed for wheezing or shortness of breath., Disp: 1 each, Rfl: 0   amLODipine (NORVASC) 2.5 MG tablet, Take 1 tablet (2.5 mg total) by mouth daily. (Patient taking differently: Take 5 mg by mouth daily.), Disp: 30 tablet, Rfl: 0   diphenhydrAMINE (BENADRYL) 25 MG tablet, Take 25 mg by mouth every 6 (six) hours as needed for itching or allergies., Disp: , Rfl:   Review of Systems:  Constitutional: Positive for chills, diaphoresis, appetite change and fatigue.  HEENT: Denies photophobia, eye pain, redness,  trouble swallowing, neck pain, neck stiffness and tinnitus.   Respiratory: Denies SOB, DOE, cough, chest tightness,  and wheezing.   Cardiovascular: Denies chest pain, palpitations and leg swelling.  Gastrointestinal: Denies nausea, vomiting, abdominal pain, diarrhea, constipation, blood in stool and abdominal distention.  Genitourinary: Denies dysuria, urgency, frequency, hematuria, flank pain and difficulty  urinating.  Endocrine: Denies: hot or cold intolerance, sweats, changes in hair or nails, polyuria, polydipsia. Musculoskeletal: Denies myalgias, back pain, joint swelling, arthralgias and gait problem.  Skin: Denies pallor, rash and wound.  Neurological: Denies dizziness, seizures, syncope, weakness, light-headedness, numbness and headaches.  Hematological: Denies adenopathy. Easy bruising, personal or family bleeding history  Psychiatric/Behavioral: Denies suicidal ideation, mood changes, confusion, nervousness, sleep disturbance and agitation    Physical Exam: Vitals:   01/07/22 1549  BP: 120/76  Pulse: 88  Temp: 97.9 F (36.6 C)  TempSrc: Oral  SpO2: 99%  Weight: 186 lb 4.8 oz (84.5 kg)    Body mass index is 30.07 kg/m.   Constitutional: NAD, calm, comfortable Eyes: PERRL, lids and conjunctivae normal ENMT: Mucous membranes are moist. Posterior pharynx is erythematous with enlarged tonsils. Respiratory: clear to auscultation bilaterally, no wheezing, no crackles. Normal respiratory effort. No accessory muscle use.  Cardiovascular: Regular rate and rhythm, no murmurs / rubs / gallops. No extremity edema.  Psychiatric: Normal judgment and insight. Alert and oriented x 3. Normal mood.    Impression and Plan:  Chills - Plan: POC Influenza A/B, POC COVID-19, POC Rapid Strep A  Other fatigue - Plan: POC Influenza A/B, POC COVID-19, POC Rapid Strep A  Sore throat - Plan: POC Rapid Strep A  -Flu and COVID tests are negative -Likely a viral illness, but have ordered strep test given tonsil redness and enlargement which has resulted negative. -Have advised rest, fluids, OTC antihistamines, cough suppressants and mucinex. -  RTC if no improvement in 10-14 days.   Time spent:23 minutes reviewing chart, interviewing and examining patient and formulating plan of care.      Chaya Jan, MD [facility]

## 2022-02-04 DIAGNOSIS — Z6829 Body mass index (BMI) 29.0-29.9, adult: Secondary | ICD-10-CM | POA: Diagnosis not present

## 2022-02-04 DIAGNOSIS — K122 Cellulitis and abscess of mouth: Secondary | ICD-10-CM | POA: Diagnosis not present

## 2022-02-13 ENCOUNTER — Ambulatory Visit: Payer: Medicaid Other | Admitting: Adult Health

## 2022-02-13 ENCOUNTER — Encounter: Payer: Self-pay | Admitting: Adult Health

## 2022-02-13 VITALS — BP 110/60 | HR 100 | Temp 98.8°F

## 2022-02-13 DIAGNOSIS — R051 Acute cough: Secondary | ICD-10-CM

## 2022-02-13 DIAGNOSIS — J0140 Acute pansinusitis, unspecified: Secondary | ICD-10-CM

## 2022-02-13 MED ORDER — DOXYCYCLINE HYCLATE 100 MG PO CAPS: 100.0000 mg | ORAL_CAPSULE | Freq: Two times a day (BID) | ORAL | 0 refills | Status: DC

## 2022-02-13 MED ORDER — BENZONATATE 200 MG PO CAPS: 200.0000 mg | ORAL_CAPSULE | Freq: Two times a day (BID) | ORAL | 0 refills | Status: DC | PRN

## 2022-02-13 NOTE — Progress Notes (Signed)
Subjective:    Patient ID: Connie Horton, female    DOB: 1990-07-08, 31 y.o.   MRN: [id number]  HPI  31 year old female who  has a past medical history of Asthma.  She presents to the office today for an acute issue.  Her symptoms started about 5 days ago with headache, sore throat, runny nose, nasal congestionHer symptoms started about 5 days ago with headache, runny, semi productive cough,  nose, sore throat, nasal congestion, and fever up to 101. 4 days ago she had 2 episodes of vomiting but not since. The rest of her symptoms continue. At home she has been using Tylenol and cold and flu medication without releif.   Review of Systems See HPI   Past Medical History:  Diagnosis Date   Asthma     Social History   Socioeconomic History   Marital status: Significant Other    Spouse name: Not on file   Number of children: Not on file   Years of education: Not on file   Highest education level: 12th grade  Occupational History   Not on file  Tobacco Use   Smoking status: Never   Smokeless tobacco: Never  Vaping Use   Vaping Use: Every day  Substance and Sexual Activity   Alcohol use: Yes    Comment: occassional   Drug use: Not Currently   Sexual activity: Yes  Other Topics Concern   Not on file  Social History Narrative   Not on file   Social Determinants of Health   Financial Resource Strain: Low Risk  (10/08/2021)   Overall Financial Resource Strain (CARDIA)    Difficulty of Paying Living Expenses: Not hard at all  Food Insecurity: No Food Insecurity (10/08/2021)   Hunger Vital Sign    Worried About Running Out of Food in the Last Year: Never true    Ran Out of Food in the Last Year: Never true  Transportation Needs: No Transportation Needs (10/08/2021)   PRAPARE - Hydrologist (Medical): No    Lack of Transportation (Non-Medical): No  Physical Activity: Insufficiently Active (10/08/2021)   Exercise Vital Sign    Days of Exercise per Week:  1 day    Minutes of Exercise per Session: 30 min  Stress: No Stress Concern Present (10/08/2021)   [place]    Feeling of Stress : Only a little  Social Connections: Socially Isolated (10/08/2021)   Social Connection and Isolation Panel [NHANES]    Frequency of Communication with Friends and Family: Once a week    Frequency of Social Gatherings with Friends and Family: Never    Attends Religious Services: Never    Marine scientist or Organizations: No    Attends Music therapist: Not on file    Marital Status: Never married  Intimate Partner Violence: Not on file    No past surgical history on file.  Family History  Problem Relation Age of Onset   CAD Mother    CVA Mother    CAD Father    CVA Father     Allergies  Allergen Reactions   Citrus Anaphylaxis   Latex Anaphylaxis   Amoxicillin Hives and Itching    Current Outpatient Medications on File Prior to Visit  Medication Sig Dispense Refill   albuterol (VENTOLIN HFA) 108 (90 Base) MCG/ACT inhaler Inhale 2 puffs into the lungs every 6 (six) hours as needed for wheezing  or shortness of breath. 1 each 0   amLODipine (NORVASC) 2.5 MG tablet Take 1 tablet (2.5 mg total) by mouth daily. (Patient taking differently: Take 5 mg by mouth daily.) 30 tablet 0   diphenhydrAMINE (BENADRYL) 25 MG tablet Take 25 mg by mouth every 6 (six) hours as needed for itching or allergies.     No current facility-administered medications on file prior to visit.    BP 110/60   Pulse 100   Temp 98.8 F (37.1 C)   SpO2 99%       Objective:   Physical Exam Vitals and nursing note reviewed.  Constitutional:      Appearance: Normal appearance.  HENT:     Nose: Congestion and rhinorrhea present. Rhinorrhea is purulent.     Right Turbinates: Enlarged and swollen.     Left Turbinates: Enlarged and swollen.     Right Sinus: Maxillary sinus tenderness and frontal  sinus tenderness present.     Left Sinus: Maxillary sinus tenderness and frontal sinus tenderness present.     Mouth/Throat:     Mouth: Mucous membranes are moist.     Pharynx: Oropharynx is clear. No oropharyngeal exudate.  Cardiovascular:     Rate and Rhythm: Normal rate and regular rhythm.     Pulses: Normal pulses.     Heart sounds: Normal heart sounds.  Pulmonary:     Effort: Pulmonary effort is normal.     Breath sounds: Normal breath sounds.  Skin:    General: Skin is warm and dry.  Neurological:     General: No focal deficit present.     Mental Status: She is alert and oriented to person, place, and time.  Psychiatric:        Mood and Affect: Mood normal.        Behavior: Behavior normal.        Thought Content: Thought content normal.        Judgment: Judgment normal.        Assessment & Plan:  1. Acute non-recurrent pansinusitis - negative POC Flu, Covid, and Strep in the office today  - doxycycline (VIBRAMYCIN) 100 MG capsule; Take 1 capsule (100 mg total) by mouth 2 (two) times daily.  Dispense: 14 capsule; Refill: 0 - Can use Flonase that she has at home - Follow up in 2-3 days if no improvement  2. Acute cough  - benzonatate (TESSALON) 200 MG capsule; Take 1 capsule (200 mg total) by mouth 2 (two) times daily as needed for cough.  Dispense: 20 capsule; Refill: 0  Shirline Frees, NP

## 2022-04-08 ENCOUNTER — Ambulatory Visit: Payer: Medicaid Other | Admitting: Sports Medicine

## 2022-04-08 VITALS — HR 73 | Ht 66.0 in | Wt 190.0 lb

## 2022-04-08 DIAGNOSIS — M79671 Pain in right foot: Secondary | ICD-10-CM | POA: Diagnosis not present

## 2022-04-08 DIAGNOSIS — M2141 Flat foot [pes planus] (acquired), right foot: Secondary | ICD-10-CM

## 2022-04-08 DIAGNOSIS — M76821 Posterior tibial tendinitis, right leg: Secondary | ICD-10-CM

## 2022-04-08 DIAGNOSIS — M722 Plantar fascial fibromatosis: Secondary | ICD-10-CM

## 2022-04-08 DIAGNOSIS — M2142 Flat foot [pes planus] (acquired), left foot: Secondary | ICD-10-CM | POA: Diagnosis not present

## 2022-04-08 MED ORDER — MELOXICAM 15 MG PO TABS: 15.0000 mg | ORAL_TABLET | Freq: Every day | ORAL | 0 refills | Status: DC

## 2022-04-08 NOTE — Patient Instructions (Addendum)
Good to see you  - Start meloxicam 15 mg daily x2 weeks.  If still having pain after 2 weeks, complete 3rd-week of meloxicam. May use remaining meloxicam as needed once daily for pain control.  Do not to use additional NSAIDs while taking meloxicam.  May use Tylenol [phone] mg 2 to 3 times a day for breakthrough pain. Ankle HEP  Recommend looking on amaz[employer] for a plantar fascial sock Pt referral  3-4 week follow up

## 2022-04-08 NOTE — Progress Notes (Signed)
Connie Horton D.[facility] [address] [place] [address] Phone: [phone]   Assessment and Plan:     1. Right foot pain 2. Posterior tibial tendinitis of right lower extremity 3. Pes planus of both feet 4. Plantar fasciitis of right foot -Chronic with exacerbation, subsequent visit - Consistent with flare of posterior tibial tendinitis as well as Planter fasciitis based on physical exam and HPI with patient starting a new job 4 months ago as well as baseline pes planus - Start meloxicam 15 mg daily x2 weeks.  If still having pain after 2 weeks, complete 3rd-week of meloxicam. May use remaining meloxicam as needed once daily for pain control.  Do not to use additional NSAIDs while taking meloxicam.  May use Tylenol [phone] mg 2 to 3 times a day for breakthrough pain. - Recommend starting HEP for ankle and plantar fasciitis - Can buy a plantar fascial night brace and use as tolerated - Start physical therapy for foot and ankle - Patient purchased new inserts and is wearing more supportive shoes since our prior office visit  Other orders - meloxicam (MOBIC) 15 MG tablet; Take 1 tablet (15 mg total) by mouth daily.    Pertinent previous records reviewed include none   Follow Up: 3 to 4 weeks for reevaluation.  If no improvement or worsening of symptoms, would obtain x-ray of foot and ankle and could consider CSI   Subjective:   I, Connie Horton, am serving as a Neurosurgeon for Doctor Richardean Sale  Chief Complaint: Right foot pain follow up    HPI:    02/11/21 Patient is a 31 year old female presenting with right foot pain since 02/05/2021. Patient was seen by her PCP on 02/07/21 for this issue and stated that she had started a second job at [employer] and is on her feet a lot. Patient does not recall an actual MOI. Patient states that she has been wearing steel toes boots without any arch support. Patient locates pain to right  foot around the arch and in her ankle and describes pain as throbbing in nature. Patient was referred to our office for treatment. Patient states that she has a flat foot so has had pain similar in the past but it is worse now than it has ever had in the past. Pain hurts worse with pressure and wearing a closed toe shoe. Patient states that the best shoe she can wear is her slide, but she knows she should wear something with more support. Patient states today her foot is not as swollen as it has been, but if she walks for too long the foot and toes will swell. Patient has to walk on her tiptoes to avoid the pain but then her toes will start to hurt.    02/21/21 Patient states been non weight bearing because of pain. Right foot. Same pain attempted to walk with inserts. Work note needs to be more specific. Pain on same medial side.  04/08/2022 Patient states that both foot and knee pain has returned she started  new job and has doing more, medial arch pain, when she wakes up in the am it feels like her foot bone is gonna fall out of her foot, pain radiates up her whole leg, knee and hip specifically, pain is worse when she wakes up in the am , states she hears a popping sound when she flexes the foot    Relevant Historical Information: Pes planus  Additional pertinent review of systems negative.   Current Outpatient Medications:    albuterol (VENTOLIN HFA) 108 (90 Base) MCG/ACT inhaler, Inhale 2 puffs into the lungs every 6 (six) hours as needed for wheezing or shortness of breath., Disp: 1 each, Rfl: 0   amLODipine (NORVASC) 2.5 MG tablet, Take 1 tablet (2.5 mg total) by mouth daily. (Patient taking differently: Take 5 mg by mouth daily.), Disp: 30 tablet, Rfl: 0   benzonatate (TESSALON) 200 MG capsule, Take 1 capsule (200 mg total) by mouth 2 (two) times daily as needed for cough., Disp: 20 capsule, Rfl: 0   diphenhydrAMINE (BENADRYL) 25 MG tablet, Take 25 mg by mouth every 6 (six) hours as needed  for itching or allergies., Disp: , Rfl:    doxycycline (VIBRAMYCIN) 100 MG capsule, Take 1 capsule (100 mg total) by mouth 2 (two) times daily., Disp: 14 capsule, Rfl: 0   meloxicam (MOBIC) 15 MG tablet, Take 1 tablet (15 mg total) by mouth daily., Disp: 30 tablet, Rfl: 0   Objective:     Vitals:   04/08/22 1548  Pulse: 73  SpO2: 99%  Weight: 190 lb (86.2 kg)  Height: 5\' 6"  (1.676 m)      Body mass index is 30.67 kg/m.    Physical Exam:    Gen: Appears well, nad, nontoxic and pleasant Psych: Alert and oriented, appropriate mood and affect Neuro: sensation intact, strength is 5/5 with df/pf/inv/ev, muscle tone wnl Skin: no susupicious lesions or rashes  Right foot/ankle: no deformity, no swelling or effusion TTP posterior medial malleolus along posterior tibialis tendon, and middle and proximal plantar fascia NTTP over fibular head, lat mal,   achilles, navicular, base of 5th, ATFL, CFL, deltoid, calcaneous or midfoot ROM DF 30, PF 45, inv/ev intact Negative ant drawer, talar tilt, rotation test, squeeze test. Neg thompson No pain with resisted inversion or eversion  Mild tenderness with resisted dorsiflexion  Electronically signed by:  D[facility] 4:08 PM 04/08/22

## 2022-04-09 ENCOUNTER — Telehealth: Payer: Self-pay | Admitting: Sports Medicine

## 2022-04-09 ENCOUNTER — Other Ambulatory Visit: Payer: Self-pay | Admitting: Sports Medicine

## 2022-04-09 NOTE — Telephone Encounter (Signed)
Patient called stating that she was seen by Dr Jean Rosenthal yesterday.  She said this morning when she got up her foot, ankle and knee were very swollen. She was not able to walk and could not go to work this morning. She asked if Dr Jean Rosenthal could write her a note stating she was seen yesterday and out of work today.  (Okay to be put in MyChart)

## 2022-04-09 NOTE — Telephone Encounter (Signed)
Letter was sent via mychart she may return to work tomorrow 04/10/2022

## 2022-04-10 ENCOUNTER — Other Ambulatory Visit: Payer: Self-pay | Admitting: Sports Medicine

## 2022-04-10 ENCOUNTER — Encounter: Payer: Self-pay | Admitting: Sports Medicine

## 2022-04-10 NOTE — Telephone Encounter (Signed)
Note was written for out of work until tomorrow 04/11/2022

## 2022-04-14 DIAGNOSIS — Z6830 Body mass index (BMI) 30.0-30.9, adult: Secondary | ICD-10-CM | POA: Diagnosis not present

## 2022-04-14 DIAGNOSIS — H10021 Other mucopurulent conjunctivitis, right eye: Secondary | ICD-10-CM | POA: Diagnosis not present

## 2022-04-14 DIAGNOSIS — I10 Essential (primary) hypertension: Secondary | ICD-10-CM | POA: Diagnosis not present

## 2022-04-21 ENCOUNTER — Ambulatory Visit: Admit: 2022-04-21 | Payer: No Typology Code available for payment source

## 2022-04-22 ENCOUNTER — Ambulatory Visit
Admission: EM | Admit: 2022-04-22 | Discharge: 2022-04-22 | Disposition: A | Discharge status: Home / Self Care | Payer: Medicaid Other | Attending: Urgent Care | Admitting: Urgent Care

## 2022-04-22 DIAGNOSIS — Z79899 Other long term (current) drug therapy: Secondary | ICD-10-CM | POA: Insufficient documentation

## 2022-04-22 DIAGNOSIS — B349 Viral infection, unspecified: Secondary | ICD-10-CM | POA: Insufficient documentation

## 2022-04-22 DIAGNOSIS — U071 COVID-19: Secondary | ICD-10-CM | POA: Diagnosis not present

## 2022-04-22 DIAGNOSIS — J4520 Mild intermittent asthma, uncomplicated: Secondary | ICD-10-CM | POA: Insufficient documentation

## 2022-04-22 DIAGNOSIS — Z7952 Long term (current) use of systemic steroids: Secondary | ICD-10-CM | POA: Insufficient documentation

## 2022-04-22 DIAGNOSIS — J4530 Mild persistent asthma, uncomplicated: Secondary | ICD-10-CM | POA: Insufficient documentation

## 2022-04-22 LAB — RESP PANEL BY RT-PCR (FLU A&B, COVID) ARPGX2
Influenza A by PCR: NEGATIVE
Influenza B by PCR: NEGATIVE
SARS Coronavirus 2 by RT PCR: POSITIVE — AB

## 2022-04-22 MED ORDER — PROMETHAZINE-DM 6.25-15 MG/5ML PO SYRP: 5.0000 mL | ORAL_SOLUTION | Freq: Three times a day (TID) | ORAL | 0 refills | Status: DC | PRN

## 2022-04-22 MED ORDER — CETIRIZINE HCL 10 MG PO TABS: 10.0000 mg | ORAL_TABLET | Freq: Every day | ORAL | 0 refills | Status: DC

## 2022-04-22 MED ORDER — ALBUTEROL SULFATE HFA 108 (90 BASE) MCG/ACT IN AERS: 2.0000 | INHALATION_SPRAY | Freq: Four times a day (QID) | RESPIRATORY_TRACT | 0 refills | Status: DC | PRN

## 2022-04-22 MED ORDER — PREDNISONE 50 MG PO TABS
50.0000 mg | ORAL_TABLET | Freq: Every day | ORAL | 0 refills | Status: DC
Start: 2022-04-22 — End: 2022-05-30

## 2022-04-22 NOTE — Discharge Instructions (Addendum)
We will notify you of your test results as they arrive and may take between about 24 hours.  I encourage you to sign up for MyChart if you have not already done so as this can be the easiest way for us to communicate results to you online or through a phone app.  Generally, we only contact you if it is a positive test result.  In the meantime, if you develop worsening symptoms including fever, chest pain, shortness of breath despite our current treatment plan then please report to the emergency room as this may be a sign of worsening status from possible viral infection.  Otherwise, we will manage this as a viral syndrome. For sore throat or cough try using a honey-based tea. Use 3 teaspoons of honey with juice squeezed from half lemon. Place shaved pieces of ginger into 1/2-1 cup of water and warm over stove top. Then mix the ingredients and repeat every 4 hours as needed. Please take Tylenol 500mg-650mg every 6 hours for aches and pains, fevers. Hydrate very well with at least 2 liters of water. Eat light meals such as soups to replenish electrolytes and soft fruits, veggies. Start an antihistamine like Zyrtec for postnasal drainage, sinus congestion.  You can take this together with prednisone and your albuterol inhaler.  Use the cough medications as needed.   

## 2022-04-22 NOTE — ED Provider Notes (Signed)
[facility]  Note:  This document was prepared using Conservation officer, historic buildings and may include unintentional dictation errors.  MRN: [id number] DOB: Apr 04, 1991  Subjective:   Connie Horton is a 31 y.o. female presenting for 3 day history of acute onset throat pain, coughing, shob, wheezing, body aches. Cough is dry, feels chest congestion, chest pressure. Works in hospital setting. Has a history of asthma, has used albuterol long term. Started using more the past few days. No smoking.   No current facility-administered medications for this encounter.  Current Outpatient Medications:    albuterol (VENTOLIN HFA) 108 (90 Base) MCG/ACT inhaler, Inhale 2 puffs into the lungs every 6 (six) hours as needed for wheezing or shortness of breath., Disp: 1 each, Rfl: 0   amLODipine (NORVASC) 2.5 MG tablet, Take 1 tablet (2.5 mg total) by mouth daily. (Patient taking differently: Take 5 mg by mouth daily.), Disp: 30 tablet, Rfl: 0   benzonatate (TESSALON) 200 MG capsule, Take 1 capsule (200 mg total) by mouth 2 (two) times daily as needed for cough., Disp: 20 capsule, Rfl: 0   diphenhydrAMINE (BENADRYL) 25 MG tablet, Take 25 mg by mouth every 6 (six) hours as needed for itching or allergies., Disp: , Rfl:    doxycycline (VIBRAMYCIN) 100 MG capsule, Take 1 capsule (100 mg total) by mouth 2 (two) times daily., Disp: 14 capsule, Rfl: 0   meloxicam (MOBIC) 15 MG tablet, Take 1 tablet (15 mg total) by mouth daily., Disp: 30 tablet, Rfl: 0   Allergies  Allergen Reactions   Citrus Anaphylaxis   Latex Anaphylaxis   Amoxicillin Hives and Itching    Past Medical History:  Diagnosis Date   Asthma      History reviewed. No pertinent surgical history.  Family History  Problem Relation Age of Onset   CAD Mother    CVA Mother    CAD Father    CVA Father     Social History   Tobacco Use   Smoking status: Never   Smokeless tobacco: Never  Vaping Use   Vaping Use:  Former  Substance Use Topics   Alcohol use: Yes    Comment: occassional   Drug use: Not Currently    ROS   Objective:   Vitals: BP 127/86 (BP Location: Right Arm)   Pulse 99   Temp 98.7 F (37.1 C) (Oral)   Resp 16   LMP 04/04/2022   SpO2 99%   Physical Exam Constitutional:      General: She is not in acute distress.    Appearance: Normal appearance. She is well-developed and normal weight. She is not ill-appearing, toxic-appearing or diaphoretic.  HENT:     Head: Normocephalic and atraumatic.     Right Ear: Tympanic membrane, ear canal and external ear normal. No drainage or tenderness. No middle ear effusion. There is no impacted cerumen. Tympanic membrane is not erythematous or bulging.     Left Ear: Tympanic membrane, ear canal and external ear normal. No drainage or tenderness.  No middle ear effusion. There is no impacted cerumen. Tympanic membrane is not erythematous or bulging.     Nose: Congestion and rhinorrhea present.     Mouth/Throat:     Mouth: Mucous membranes are moist. No oral lesions.     Pharynx: No pharyngeal swelling, oropharyngeal exudate, posterior oropharyngeal erythema or uvula swelling.     Tonsils: No tonsillar exudate or tonsillar abscesses.     Comments: Thick post-nasal drainage overlying pharynx.  Eyes:     General: No scleral icterus.       Right eye: No discharge.        Left eye: No discharge.     Extraocular Movements: Extraocular movements intact.     Right eye: Normal extraocular motion.     Left eye: Normal extraocular motion.     Conjunctiva/sclera: Conjunctivae normal.  Cardiovascular:     Rate and Rhythm: Normal rate and regular rhythm.     Heart sounds: Normal heart sounds. No murmur heard.    No friction rub. No gallop.  Pulmonary:     Effort: Pulmonary effort is normal. No respiratory distress.     Breath sounds: No stridor. No wheezing, rhonchi or rales.     Comments: Slight decrease in lung sounds throughout.  Chest:      Chest wall: No tenderness.  Musculoskeletal:     Cervical back: Normal range of motion and neck supple.  Lymphadenopathy:     Cervical: No cervical adenopathy.  Skin:    General: Skin is warm and dry.  Neurological:     General: No focal deficit present.     Mental Status: She is alert and oriented to person, place, and time.  Psychiatric:        Mood and Affect: Mood normal.        Behavior: Behavior normal.     Assessment and Plan :   PDMP not reviewed this encounter.  1. Acute viral syndrome   2. Mild persistent asthma without complication   3. Mild intermittent asthma without complication     Does not meet [facility] criteria for strep testing. Deferred imaging given clear cardiopulmonary exam, hemodynamically stable vital signs. Will manage for viral illness such as viral URI, viral syndrome, viral rhinitis, COVID-19. Recommended supportive care. Offered scripts for symptomatic relief. Testing is pending. Counseled patient on potential for adverse effects with medications prescribed/recommended today, ER and return-to-clinic precautions discussed, patient verbalized understanding.   Patient should get anti-virals if she tests positive for either infection.    Wallis Bamberg, PA-C 04/22/22 1630

## 2022-04-22 NOTE — ED Triage Notes (Signed)
Pt c/o cough, SHOB, wheezing, sore throat x 3 days-no relieved with albuterol inhaler-NAD-steady gait

## 2022-04-23 ENCOUNTER — Telehealth (HOSPITAL_COMMUNITY): Payer: Self-pay | Admitting: Emergency Medicine

## 2022-04-23 MED ORDER — NIRMATRELVIR/RITONAVIR (PAXLOVID)TABLET: 3.0000 | ORAL_TABLET | Freq: Two times a day (BID) | ORAL | 0 refills | Status: AC

## 2022-05-02 NOTE — Progress Notes (Signed)
Connie Horton D.[facility] [address] [place] [address] Phone: [phone]   Assessment and Plan:     1. Right foot pain 3. Pes planus of both feet 4. Plantar fasciitis of right foot  -Chronic with exacerbation, subsequent visit - Overall improvement in flare of pain after completion of 3-week course of meloxicam, getting new tennis shoes and plantar fascial inserts as well as starting HEP - Patient was unable to start physical therapy, but has first appointment starting tomorrow - Recommend continuing HEP and physical therapy - Continue to use supportive footwear as well as plantar fascial inserts - Discontinue daily meloxicam and may use remainder as needed for breakthrough pain - Start Tylenol 500 to 1000 mg tablets 2-3 times a day as needed for day-to-day pain relief  -X-ray obtained in clinic.  My interpretation: No acute fracture or dislocation.  Pes planus  2. Chronic pain of right knee -Chronic with exacerbation, subsequent sports medicine visit - Continued right knee pain that overall has had no improvement despite improvement in foot and ankle pain.  My initial suspicion was that right knee pain was due to strain caused from compensation with right plantar fasciitis - Will continue to treat Planter fasciitis and see if knee pain resolves with patient having a more natural gait - Start physical therapy and HEP - X-ray obtained in clinic.  My interpretation: No acute fracture or dislocation.  Unremarkable imaging - DG Knee AP/LAT W/Sunrise Right; Future - DG Foot Complete Right; Future  Pertinent previous records reviewed include none   Follow Up: 4 weeks for reevaluation.  Could consider CSI to right knee versus plantar fascia based on symptoms   Subjective:   I, Connie Horton, am serving as a Neurosurgeon for Doctor Richardean Sale   Chief Complaint: Right foot pain follow up    HPI:    02/11/21 Patient is a 31 year old  female presenting with right foot pain since 02/05/2021. Patient was seen by her PCP on 02/07/21 for this issue and stated that she had started a second job at [employer] and is on her feet a lot. Patient does not recall an actual MOI. Patient states that she has been wearing steel toes boots without any arch support. Patient locates pain to right foot around the arch and in her ankle and describes pain as throbbing in nature. Patient was referred to our office for treatment. Patient states that she has a flat foot so has had pain similar in the past but it is worse now than it has ever had in the past. Pain hurts worse with pressure and wearing a closed toe shoe. Patient states that the best shoe she can wear is her slide, but she knows she should wear something with more support. Patient states today her foot is not as swollen as it has been, but if she walks for too long the foot and toes will swell. Patient has to walk on her tiptoes to avoid the pain but then her toes will start to hurt.    02/21/21 Patient states been non weight bearing because of pain. Right foot. Same pain attempted to walk with inserts. Work note needs to be more specific. Pain on same medial side.   04/08/2022 Patient states that both foot and knee pain has returned she started  new job and has doing more, medial arch pain, when she wakes up in the am it feels like her foot bone is [place]  fall out of her foot, pain radiates up her whole leg, knee and hip specifically, pain is worse when she wakes up in the am , states she hears a popping sound when she flexes the foot   05/06/2022 Patient states that she has gotten better, she does have flares had one last week , stopped meloxicam she is okay right now , has  PT lined up, plantar fascitis support helps, knee hurts all the time      Relevant Historical Information: Pes planus  Additional pertinent review of systems negative.   Current Outpatient Medications:    albuterol  (VENTOLIN HFA) 108 (90 Base) MCG/ACT inhaler, Inhale 2 puffs into the lungs every 6 (six) hours as needed for wheezing or shortness of breath., Disp: 18 g, Rfl: 0   amLODipine (NORVASC) 2.5 MG tablet, Take 1 tablet (2.5 mg total) by mouth daily. (Patient taking differently: Take 5 mg by mouth daily.), Disp: 30 tablet, Rfl: 0   cetirizine (ZYRTEC ALLERGY) 10 MG tablet, Take 1 tablet (10 mg total) by mouth daily., Disp: 30 tablet, Rfl: 0   diphenhydrAMINE (BENADRYL) 25 MG tablet, Take 25 mg by mouth every 6 (six) hours as needed for itching or allergies., Disp: , Rfl:    meloxicam (MOBIC) 15 MG tablet, Take 1 tablet (15 mg total) by mouth daily., Disp: 30 tablet, Rfl: 0   benzonatate (TESSALON) 200 MG capsule, Take 1 capsule (200 mg total) by mouth 2 (two) times daily as needed for cough. (Patient not taking: Reported on 05/06/2022), Disp: 20 capsule, Rfl: 0   doxycycline (VIBRAMYCIN) 100 MG capsule, Take 1 capsule (100 mg total) by mouth 2 (two) times daily. (Patient not taking: Reported on 05/06/2022), Disp: 14 capsule, Rfl: 0   predniSONE (DELTASONE) 50 MG tablet, Take 1 tablet (50 mg total) by mouth daily with breakfast. (Patient not taking: Reported on 05/06/2022), Disp: 5 tablet, Rfl: 0   promethazine-dextromethorphan (PROMETHAZINE-DM) 6.25-15 MG/5ML syrup, Take 5 mLs by mouth 3 (three) times daily as needed for cough. (Patient not taking: Reported on 05/06/2022), Disp: 200 mL, Rfl: 0   Objective:     Vitals:   05/06/22 1553  BP: 118/70  Weight: 188 lb (85.3 kg)  Height: 5\' 6"  (1.676 m)      Body mass index is 30.34 kg/m.    Physical Exam:    Gen: Appears well, nad, nontoxic and pleasant Psych: Alert and oriented, appropriate mood and affect Neuro: sensation intact, strength is 5/5 with df/pf/inv/ev, muscle tone wnl Skin: no susupicious lesions or rashes   Right foot/ankle: no deformity, no swelling or effusion TTP mildly posterior medial malleolus along posterior tibialis tendon, and  middle and proximal plantar fascia NTTP over fibular head, lat mal,   achilles, navicular, base of 5th, ATFL, CFL, deltoid, calcaneous or midfoot ROM DF 30, PF 45, inv/ev intact Negative ant drawer, talar tilt, rotation test, squeeze test. Neg thompson No pain with resisted inversion or eversion  Minimal tenderness with resisted dorsiflexion    Electronically signed by:  Connie Horton [facility] 4:32 PM 05/06/22

## 2022-05-06 ENCOUNTER — Ambulatory Visit (INDEPENDENT_AMBULATORY_CARE_PROVIDER_SITE_OTHER): Payer: Medicaid Other

## 2022-05-06 ENCOUNTER — Ambulatory Visit: Payer: Medicaid Other | Admitting: Sports Medicine

## 2022-05-06 VITALS — BP 118/70 | Ht 66.0 in | Wt 188.0 lb

## 2022-05-06 DIAGNOSIS — M2141 Flat foot [pes planus] (acquired), right foot: Secondary | ICD-10-CM

## 2022-05-06 DIAGNOSIS — M722 Plantar fascial fibromatosis: Secondary | ICD-10-CM | POA: Diagnosis not present

## 2022-05-06 DIAGNOSIS — M25561 Pain in right knee: Secondary | ICD-10-CM

## 2022-05-06 DIAGNOSIS — M79671 Pain in right foot: Secondary | ICD-10-CM

## 2022-05-06 DIAGNOSIS — G8929 Other chronic pain: Secondary | ICD-10-CM | POA: Diagnosis not present

## 2022-05-06 DIAGNOSIS — M2142 Flat foot [pes planus] (acquired), left foot: Secondary | ICD-10-CM | POA: Diagnosis not present

## 2022-05-06 NOTE — Patient Instructions (Addendum)
Good to see you Discontinue daily meloxicam use remainder as needed no more than 1-2 times per week Tylenol [phone] mg 2-3 times a day for pain relief  Continue HEP and PT 4 week follow up

## 2022-05-06 NOTE — Therapy (Incomplete)
OUTPATIENT PHYSICAL THERAPY LOWER EXTREMITY EVALUATION   Patient Name: Connie Horton MRN: [id number] DOB:1990-10-31, 32 y.o., female Today's Date: 05/06/2022  END OF SESSION:   Past Medical History:  Diagnosis Date   Asthma    No past surgical history on file. Patient Active Problem List   Diagnosis Date Noted   Hypertension 10/09/2021   Asthma, mild intermittent 04/18/2020    PCP: Isaac Bliss, Rayford Halsted, MD  REFERRING PROVIDER: Glennon Mac, DO  REFERRING DIAG:  [phone] (ICD-10-CM) - Right foot pain M76.821 (ICD-10-CM) - Posterior tibial tendinitis of right lower extremity M21.41,M21.42 (ICD-10-CM) - Pes planus of both feet M76.811 (ICD-10-CM) - Anterior tibialis tendinitis of right lower extremity  THERAPY DIAG:  No diagnosis found.  Rationale for Evaluation and Treatment: Rehabilitation  ONSET DATE: ***  SUBJECTIVE:   SUBJECTIVE STATEMENT: ***  PERTINENT HISTORY: *** PAIN:  Are you having pain? {OPRCPAIN:27236}  PRECAUTIONS: {Therapy precautions:24002}  WEIGHT BEARING RESTRICTIONS: {Yes ***/No:24003}  FALLS:  Has patient fallen in last 6 months? {fallsyesno:27318}  LIVING ENVIRONMENT: Lives with: {[facility] lives with:25569::"lives with their family"} Lives in: {Lives in:[address]} Stairs: {opstairs:27293} Has following equipment at home: {Assistive devices:23999}  OCCUPATION: ***  PLOF: {PLOF:24004}  PATIENT GOALS: ***  NEXT MD VISIT:   OBJECTIVE:   DIAGNOSTIC FINDINGS: ***  PATIENT SURVEYS:  {rehab surveys:24030}  COGNITION: Overall cognitive status: {cognition:24006}     SENSATION: {sensation:27233}  EDEMA:  {edema:24020}  MUSCLE LENGTH: Hamstrings: Right *** deg; Left *** deg Thomas test: Right *** deg; Left *** deg  POSTURE: {posture:25561}  PALPATION: ***  LOWER EXTREMITY ROM:  {AROM/PROM:27142} ROM Right eval Left eval  Hip flexion    Hip extension    Hip abduction    Hip adduction    Hip internal rotation     Hip external rotation    Knee flexion    Knee extension    Ankle dorsiflexion    Ankle plantarflexion    Ankle inversion    Ankle eversion     (Blank rows = not tested)  LOWER EXTREMITY MMT:  MMT Right eval Left eval  Hip flexion    Hip extension    Hip abduction    Hip adduction    Hip internal rotation    Hip external rotation    Knee flexion    Knee extension    Ankle dorsiflexion    Ankle plantarflexion    Ankle inversion    Ankle eversion     (Blank rows = not tested)  LOWER EXTREMITY SPECIAL TESTS:  {LEspecialtests:26242}  FUNCTIONAL TESTS:  {Functional tests:24029}  GAIT: Distance walked: *** Assistive device utilized: {Assistive devices:23999} Level of assistance: {Levels of assistance:24026} Comments: ***   TREATMENT: [facility] Adult PT Treatment:                                                DATE: *** Therapeutic Exercise: ***  PATIENT EDUCATION:  Education details: *** Person educated: {Person educated:25204} Education method: {Education Method:25205} Education comprehension: {Education Comprehension:25206}  HOME EXERCISE PROGRAM: ***  ASSESSMENT:  CLINICAL IMPRESSION: Patient is a *** y.o. *** who was seen today for physical therapy evaluation and treatment for ***.   OBJECTIVE IMPAIRMENTS: {opptimpairments:25111}.   ACTIVITY LIMITATIONS: {activitylimitations:27494}  PARTICIPATION LIMITATIONS: {participationrestrictions:25113}  PERSONAL FACTORS: {Personal factors:25162} are also affecting patient's functional outcome.   REHAB POTENTIAL: {rehabpotential:25112}  CLINICAL DECISION MAKING: {clinical decision making:25114}  EVALUATION COMPLEXITY: {Evaluation complexity:25115}   GOALS: Goals reviewed with patient? No  SHORT TERM GOALS: Target date: *** Pt will be compliant and knowledgeable with initial HEP for improved comfort and carryover Baseline: initial HEP given  Goal status: INITIAL  2.  Pt will self report *** pain no  greater than ***/10 for improved comfort and functional ability Baseline: ***/10 at worst Goal status: {GOALSTATUS:25110}   LONG TERM GOALS: Target date: ***  Pt will improve FOTO function score to no less than ***% as proxy for functional improvement Baseline: ***% function Goal status: {GOALSTATUS:25110}   2.  Pt will increase 30 Second Sit to Stand rep count to no less than *** reps for improved balance, strength, and functional mobility Baseline: *** reps  Goal status: {GOALSTATUS:25110}   3.  *** Baseline:  Goal status: {GOALSTATUS:25110}  4.  *** Baseline:  Goal status: {GOALSTATUS:25110}  5.  *** Baseline:  Goal status: {GOALSTATUS:25110}  PLAN:  PT FREQUENCY: {rehab frequency:25116}  PT DURATION: {rehab duration:25117}  PLANNED INTERVENTIONS: {rehab planned interventions:25118::"Therapeutic exercises","Therapeutic activity","Neuromuscular re-education","Balance training","Gait training","Patient/Family education","Self Care","Joint mobilization"}  PLAN FOR NEXT SESSION: ***   Ward Chatters, PT 05/06/2022, 8:48 AM

## 2022-05-07 ENCOUNTER — Ambulatory Visit: Payer: No Typology Code available for payment source

## 2022-05-12 ENCOUNTER — Ambulatory Visit: Payer: No Typology Code available for payment source

## 2022-05-12 NOTE — Therapy (Incomplete)
OUTPATIENT PHYSICAL THERAPY LOWER EXTREMITY EVALUATION   Patient Name: Connie Horton MRN: [id number] DOB:May 27, 1990, 32 y.o., female Today's Date: 05/12/2022  END OF SESSION:   Past Medical History:  Diagnosis Date   Asthma    No past surgical history on file. Patient Active Problem List   Diagnosis Date Noted   Hypertension 10/09/2021   Asthma, mild intermittent 04/18/2020    PCP: Isaac Bliss, Rayford Halsted, MD  REFERRING PROVIDER: Glennon Mac, DO  REFERRING DIAG:  [phone] (ICD-10-CM) - Right foot pain M76.821 (ICD-10-CM) - Posterior tibial tendinitis of right lower extremity M21.41,M21.42 (ICD-10-CM) - Pes planus of both feet M76.811 (ICD-10-CM) - Anterior tibialis tendinitis of right lower extremity  THERAPY DIAG:  No diagnosis found.  Rationale for Evaluation and Treatment: Rehabilitation  ONSET DATE: ***  SUBJECTIVE:   SUBJECTIVE STATEMENT: ***  PERTINENT HISTORY: *** PAIN:  Are you having pain? {OPRCPAIN:27236}  PRECAUTIONS: {Therapy precautions:24002}  WEIGHT BEARING RESTRICTIONS: {Yes ***/No:24003}  FALLS:  Has patient fallen in last 6 months? {fallsyesno:27318}  LIVING ENVIRONMENT: Lives with: {[facility] lives with:25569::"lives with their family"} Lives in: {Lives in:[address]} Stairs: {opstairs:27293} Has following equipment at home: {Assistive devices:23999}  OCCUPATION: ***  PLOF: {PLOF:24004}  PATIENT GOALS: ***  NEXT MD VISIT:   OBJECTIVE:   DIAGNOSTIC FINDINGS: ***  PATIENT SURVEYS:  {rehab surveys:24030}  COGNITION: Overall cognitive status: {cognition:24006}     SENSATION: {sensation:27233}  EDEMA:  {edema:24020}  MUSCLE LENGTH: Hamstrings: Right *** deg; Left *** deg Thomas test: Right *** deg; Left *** deg  POSTURE: {posture:25561}  PALPATION: ***  LOWER EXTREMITY ROM:  {AROM/PROM:27142} ROM Right eval Left eval  Hip flexion    Hip extension    Hip abduction    Hip adduction    Hip internal rotation     Hip external rotation    Knee flexion    Knee extension    Ankle dorsiflexion    Ankle plantarflexion    Ankle inversion    Ankle eversion     (Blank rows = not tested)  LOWER EXTREMITY MMT:  MMT Right eval Left eval  Hip flexion    Hip extension    Hip abduction    Hip adduction    Hip internal rotation    Hip external rotation    Knee flexion    Knee extension    Ankle dorsiflexion    Ankle plantarflexion    Ankle inversion    Ankle eversion     (Blank rows = not tested)  LOWER EXTREMITY SPECIAL TESTS:  {LEspecialtests:26242}  FUNCTIONAL TESTS:  {Functional tests:24029}  GAIT: Distance walked: *** Assistive device utilized: {Assistive devices:23999} Level of assistance: {Levels of assistance:24026} Comments: ***   TREATMENT: [facility] Adult PT Treatment:                                                DATE: *** Therapeutic Exercise: ***  PATIENT EDUCATION:  Education details: *** Person educated: {Person educated:25204} Education method: {Education Method:25205} Education comprehension: {Education Comprehension:25206}  HOME EXERCISE PROGRAM: ***  ASSESSMENT:  CLINICAL IMPRESSION: Patient is a *** y.o. *** who was seen today for physical therapy evaluation and treatment for ***.   OBJECTIVE IMPAIRMENTS: {opptimpairments:25111}.   ACTIVITY LIMITATIONS: {activitylimitations:27494}  PARTICIPATION LIMITATIONS: {participationrestrictions:25113}  PERSONAL FACTORS: {Personal factors:25162} are also affecting patient's functional outcome.   REHAB POTENTIAL: {rehabpotential:25112}  CLINICAL DECISION MAKING: {clinical decision making:25114}  EVALUATION COMPLEXITY: {Evaluation complexity:25115}   GOALS: Goals reviewed with patient? No  SHORT TERM GOALS: Target date: *** Pt will be compliant and knowledgeable with initial HEP for improved comfort and carryover Baseline: initial HEP given  Goal status: INITIAL  2.  Pt will self report *** pain no  greater than ***/10 for improved comfort and functional ability Baseline: ***/10 at worst Goal status: {GOALSTATUS:25110}   LONG TERM GOALS: Target date: ***  Pt will improve FOTO function score to no less than ***% as proxy for functional improvement Baseline: ***% function Goal status: {GOALSTATUS:25110}   2.  Pt will increase 30 Second Sit to Stand rep count to no less than *** reps for improved balance, strength, and functional mobility Baseline: *** reps  Goal status: {GOALSTATUS:25110}   3.  *** Baseline:  Goal status: {GOALSTATUS:25110}  4.  *** Baseline:  Goal status: {GOALSTATUS:25110}  5.  *** Baseline:  Goal status: {GOALSTATUS:25110}  PLAN:  PT FREQUENCY: {rehab frequency:25116}  PT DURATION: {rehab duration:25117}  PLANNED INTERVENTIONS: {rehab planned interventions:25118::"Therapeutic exercises","Therapeutic activity","Neuromuscular re-education","Balance training","Gait training","Patient/Family education","Self Care","Joint mobilization"}  PLAN FOR NEXT SESSION: ***   Eloy End, PT 05/12/2022, 7:42 AM

## 2022-05-21 ENCOUNTER — Ambulatory Visit: Payer: Medicaid Other | Attending: Sports Medicine

## 2022-05-21 NOTE — Therapy (Incomplete)
OUTPATIENT PHYSICAL THERAPY LOWER EXTREMITY EVALUATION   Patient Name: Connie Horton MRN: [id number] DOB:05/26/1990, 32 y.o., female Today's Date: 05/21/2022  END OF SESSION:   Past Medical History:  Diagnosis Date   Asthma    No past surgical history on file. Patient Active Problem List   Diagnosis Date Noted   Hypertension 10/09/2021   Asthma, mild intermittent 04/18/2020    PCP: Isaac Bliss, Rayford Halsted, MD  REFERRING PROVIDER: Glennon Mac, DO  REFERRING DIAG:  [phone] (ICD-10-CM) - Right foot pain M76.821 (ICD-10-CM) - Posterior tibial tendinitis of right lower extremity M21.41,M21.42 (ICD-10-CM) - Pes planus of both feet M76.811 (ICD-10-CM) - Anterior tibialis tendinitis of right lower extremity  THERAPY DIAG:  No diagnosis found.  Rationale for Evaluation and Treatment: Rehabilitation  ONSET DATE: ***  SUBJECTIVE:   SUBJECTIVE STATEMENT: ***  PERTINENT HISTORY: *** PAIN:  Are you having pain? {OPRCPAIN:27236}  PRECAUTIONS: {Therapy precautions:24002}  WEIGHT BEARING RESTRICTIONS: {Yes ***/No:24003}  FALLS:  Has patient fallen in last 6 months? {fallsyesno:27318}  LIVING ENVIRONMENT: Lives with: {[facility] lives with:25569::"lives with their family"} Lives in: {Lives in:25570} Stairs: {opstairs:27293} Has following equipment at home: {Assistive devices:23999}  OCCUPATION: ***  PLOF: {PLOF:24004}  PATIENT GOALS: ***  NEXT MD VISIT:   OBJECTIVE:   DIAGNOSTIC FINDINGS: ***  PATIENT SURVEYS:  {rehab surveys:24030}  COGNITION: Overall cognitive status: {cognition:24006}     SENSATION: {sensation:27233}  EDEMA:  {edema:24020}  MUSCLE LENGTH: Hamstrings: Right *** deg; Left *** deg Thomas test: Right *** deg; Left *** deg  POSTURE: {posture:25561}  PALPATION: ***  LOWER EXTREMITY ROM:  {AROM/PROM:27142} ROM Right eval Left eval  Hip flexion    Hip extension    Hip abduction    Hip adduction    Hip internal rotation     Hip external rotation    Knee flexion    Knee extension    Ankle dorsiflexion    Ankle plantarflexion    Ankle inversion    Ankle eversion     (Blank rows = not tested)  LOWER EXTREMITY MMT:  MMT Right eval Left eval  Hip flexion    Hip extension    Hip abduction    Hip adduction    Hip internal rotation    Hip external rotation    Knee flexion    Knee extension    Ankle dorsiflexion    Ankle plantarflexion    Ankle inversion    Ankle eversion     (Blank rows = not tested)  LOWER EXTREMITY SPECIAL TESTS:  {LEspecialtests:26242}  FUNCTIONAL TESTS:  {Functional tests:24029}  GAIT: Distance walked: *** Assistive device utilized: {Assistive devices:23999} Level of assistance: {Levels of assistance:24026} Comments: ***   TREATMENT: [facility] Adult PT Treatment:                                                DATE: *** Therapeutic Exercise: ***  PATIENT EDUCATION:  Education details: *** Person educated: {Person educated:25204} Education method: {Education Method:25205} Education comprehension: {Education Comprehension:25206}  HOME EXERCISE PROGRAM: ***  ASSESSMENT:  CLINICAL IMPRESSION: Patient is a *** y.o. *** who was seen today for physical therapy evaluation and treatment for ***.   OBJECTIVE IMPAIRMENTS: {opptimpairments:25111}.   ACTIVITY LIMITATIONS: {activitylimitations:27494}  PARTICIPATION LIMITATIONS: {participationrestrictions:25113}  PERSONAL FACTORS: {Personal factors:25162} are also affecting patient's functional outcome.   REHAB POTENTIAL: {rehabpotential:25112}  CLINICAL DECISION MAKING: {clinical decision making:25114}  EVALUATION COMPLEXITY: {Evaluation complexity:25115}   GOALS: Goals reviewed with patient? No  SHORT TERM GOALS: Target date: *** Pt will be compliant and knowledgeable with initial HEP for improved comfort and carryover Baseline: initial HEP given  Goal status: INITIAL  2.  Pt will self report *** pain no  greater than ***/10 for improved comfort and functional ability Baseline: ***/10 at worst Goal status: {GOALSTATUS:25110}   LONG TERM GOALS: Target date: ***  Pt will improve FOTO function score to no less than ***% as proxy for functional improvement Baseline: ***% function Goal status: {GOALSTATUS:25110}   2.  Pt will increase 30 Second Sit to Stand rep count to no less than *** reps for improved balance, strength, and functional mobility Baseline: *** reps  Goal status: {GOALSTATUS:25110}   3.  *** Baseline:  Goal status: {GOALSTATUS:25110}  4.  *** Baseline:  Goal status: {GOALSTATUS:25110}  5.  *** Baseline:  Goal status: {GOALSTATUS:25110}  PLAN:  PT FREQUENCY: {rehab frequency:25116}  PT DURATION: {rehab duration:25117}  PLANNED INTERVENTIONS: {rehab planned interventions:25118::"Therapeutic exercises","Therapeutic activity","Neuromuscular re-education","Balance training","Gait training","Patient/Family education","Self Care","Joint mobilization"}  PLAN FOR NEXT SESSION: ***   Ward Chatters, PT 05/21/2022, 1:01 PM

## 2022-05-29 ENCOUNTER — Emergency Department (HOSPITAL_COMMUNITY)
Admission: EM | Admit: 2022-05-29 | Discharge: 2022-05-29 | Disposition: A | Discharge status: Home / Self Care | Payer: Managed Care, Other (non HMO) | Attending: Emergency Medicine | Admitting: Emergency Medicine

## 2022-05-29 ENCOUNTER — Other Ambulatory Visit: Payer: Self-pay

## 2022-05-29 ENCOUNTER — Encounter: Payer: Self-pay | Admitting: Internal Medicine

## 2022-05-29 ENCOUNTER — Encounter (HOSPITAL_COMMUNITY): Payer: Self-pay | Admitting: Emergency Medicine

## 2022-05-29 DIAGNOSIS — J029 Acute pharyngitis, unspecified: Secondary | ICD-10-CM | POA: Diagnosis not present

## 2022-05-29 DIAGNOSIS — H9201 Otalgia, right ear: Secondary | ICD-10-CM | POA: Diagnosis not present

## 2022-05-29 DIAGNOSIS — Z91040 Latex allergy status: Secondary | ICD-10-CM | POA: Insufficient documentation

## 2022-05-29 DIAGNOSIS — Z1152 Encounter for screening for COVID-19: Secondary | ICD-10-CM | POA: Insufficient documentation

## 2022-05-29 HISTORY — DX: Essential (primary) hypertension: I10

## 2022-05-29 LAB — RESP PANEL BY RT-PCR (RSV, FLU A&B, COVID)  RVPGX2
Influenza A by PCR: NEGATIVE
Influenza B by PCR: NEGATIVE
Resp Syncytial Virus by PCR: NEGATIVE
SARS Coronavirus 2 by RT PCR: NEGATIVE

## 2022-05-29 LAB — GROUP A STREP BY PCR: Group A Strep by PCR: NOT DETECTED

## 2022-05-29 MED ORDER — DEXAMETHASONE 10 MG/ML FOR PEDIATRIC ORAL USE
10.0000 mg | Freq: Once | INTRAMUSCULAR | Status: AC
  Administered 2022-05-29: 10 mg via ORAL
  Filled 2022-05-29: qty 1

## 2022-05-29 MED ORDER — DEXAMETHASONE 1 MG/ML PO CONC: 10.0000 mg | Freq: Once | ORAL | Status: DC

## 2022-05-29 NOTE — ED Triage Notes (Signed)
Presents for sore throat, painful swallowing, R ear pain with swallowing, tooth pain x 1week. Has several teeth that need to be pulled (mult broken teeth and cavities), has an appt for tomorrow for PCP but is in severe pain. Not currently taking abx.  Endorses subj fever, chills

## 2022-05-29 NOTE — Discharge Instructions (Addendum)
Thank you for allowing me to be part of your care today.  You tested negative for strep, COVID, flu, and RSV.  Your symptoms are likely due to a another virus.  Because this is likely due to a virus, your symptoms will have to improve with time on their own.  While you were in the ED, you were given an oral steroid to help with swelling and pain.  At home I recommend you take Tylenol and ibuprofen to help with your sore throat.  You may begin taking ibuprofen tomorrow, but you may start taking Tylenol tonight.  I encourage you to stay well-hydrated by drinking water, juice, electrolyte drinks.  You may also drink hot drinks such as tea to help soothe the throat or use cold foods and drinks such as popsicles.  Please keep your primary care appointment that you have scheduled for tomorrow so they can follow-up with you.  Return to the ED if you experience worsening of your symptoms including the inability to swallow your saliva, feeling that you cannot breathe, feeling like your throat is closing.

## 2022-05-29 NOTE — ED Provider Triage Note (Signed)
Emergency Medicine Provider Triage Evaluation Note  Connie Horton , a 32 y.o. female  was evaluated in triage.  Pt complains of severe sore throat and painful swallowing for the past week.  She also has right ear pain with swallowing.  She reports she has an appointment tomorrow for her PCP but is in severe pain.  She also endorses subjective fever and chills.  Denies congestion, cough, chest pain, rhinorrhea.  Review of Systems  Positive: As above Negative: As above  Physical Exam  BP (!) 165/107 (BP Location: Right Arm)   Pulse (!) 105   Temp 98.7 F (37.1 C) (Oral)   Resp 18   Ht 5\' 6"  (1.676 m)   Wt 83 kg   LMP 05/25/2022 (Approximate)   SpO2 100%   BMI 29.53 kg/m  Gen:   Awake, no distress   Resp:  Normal effort  MSK:   Moves extremities without difficulty  Other:  Posterior oropharynx erythematous with enlarged tonsils  Medical Decision Making  Medically screening exam initiated at 9:11 PM.  Appropriate orders placed.  Jaliya Siegmann was informed that the remainder of the evaluation will be completed by another provider, this initial triage assessment does not replace that evaluation, and the importance of remaining in the ED until their evaluation is complete.     Pat Kocher, [place] 05/29/22 2113

## 2022-05-29 NOTE — ED Provider Notes (Signed)
[place] EMERGENCY DEPARTMENT AT [facility] Provider Note   CSN: [id number] Arrival date & time: 05/29/22  2048     History  Chief Complaint  Patient presents with   Sore Throat    Connie Horton is a 32 y.o. female presents to the ED complaining of sore throat and painful swallowing.  She also reports R ear pain with swallowing and tooth pain for past week.  Patient states she is able to swallow, but it is very painful.  She has appointment with PCP tomorrow, but felt like she could not wait due to severe pain.  She also endorses subjective fever and chills.  Denies shortness of breath, chest pain, congestion, rhinorrhea, nausea, vomiting, diarrhea, trouble swallowing.         Home Medications Prior to Admission medications   Medication Sig Start Date End Date Taking? Authorizing Provider  albuterol (VENTOLIN HFA) 108 (90 Base) MCG/ACT inhaler Inhale 2 puffs into the lungs every 6 (six) hours as needed for wheezing or shortness of breath. 04/22/22   Jaynee Eagles, PA-C  amLODipine (NORVASC) 2.5 MG tablet Take 1 tablet (2.5 mg total) by mouth daily. Patient taking differently: Take 5 mg by mouth daily. 11/12/21   Nafziger, Tommi Rumps, NP  benzonatate (TESSALON) 200 MG capsule Take 1 capsule (200 mg total) by mouth 2 (two) times daily as needed for cough. Patient not taking: Reported on 05/06/2022 02/13/22   Dorothyann Peng, NP  cetirizine (ZYRTEC ALLERGY) 10 MG tablet Take 1 tablet (10 mg total) by mouth daily. 04/22/22   Jaynee Eagles, PA-C  diphenhydrAMINE (BENADRYL) 25 MG tablet Take 25 mg by mouth every 6 (six) hours as needed for itching or allergies.    [provider]  doxycycline (VIBRAMYCIN) 100 MG capsule Take 1 capsule (100 mg total) by mouth 2 (two) times daily. Patient not taking: Reported on 05/06/2022 02/13/22   Dorothyann Peng, NP  meloxicam (MOBIC) 15 MG tablet Take 1 tablet (15 mg total) by mouth daily. 04/08/22   Glennon Mac, DO  predniSONE (DELTASONE)  50 MG tablet Take 1 tablet (50 mg total) by mouth daily with breakfast. Patient not taking: Reported on 05/06/2022 04/22/22   Jaynee Eagles, PA-C  promethazine-dextromethorphan (PROMETHAZINE-DM) 6.25-15 MG/5ML syrup Take 5 mLs by mouth 3 (three) times daily as needed for cough. Patient not taking: Reported on 05/06/2022 04/22/22   Jaynee Eagles, PA-C      Allergies    Citrus, Latex, and Amoxicillin    Review of Systems   Review of Systems  Constitutional:  Positive for chills and fever (subjective).  HENT:  Positive for sore throat. Negative for congestion, rhinorrhea and trouble swallowing.   Respiratory:  Negative for shortness of breath.   Gastrointestinal:  Negative for diarrhea, nausea and vomiting.    Physical Exam Updated Vital Signs BP (!) 165/107 (BP Location: Right Arm)   Pulse (!) 105   Temp 98.7 F (37.1 C) (Oral)   Resp 18   Ht 5\' 6"  (1.676 m)   Wt 83 kg   LMP 05/25/2022 (Approximate)   SpO2 100%   BMI 29.53 kg/m  Physical Exam Vitals and nursing note reviewed.  Constitutional:      General: She is not in acute distress.    Appearance: Normal appearance. She is not ill-appearing or diaphoretic.  HENT:     Right Ear: Tympanic membrane and ear canal normal. No middle ear effusion. Tympanic membrane is not erythematous.     Left Ear: Tympanic membrane and ear  canal normal.  No middle ear effusion. Tympanic membrane is not erythematous.     Nose: No congestion or rhinorrhea.     Mouth/Throat:     Mouth: Mucous membranes are moist.     Pharynx: Uvula midline. Pharyngeal swelling and posterior oropharyngeal erythema present. No oropharyngeal exudate or uvula swelling.     Tonsils: No tonsillar exudate or tonsillar abscesses. 2+ on the right. 2+ on the left.  Cardiovascular:     Rate and Rhythm: Normal rate and regular rhythm.     Heart sounds: Normal heart sounds.  Pulmonary:     Effort: Pulmonary effort is normal.     Breath sounds: Normal breath sounds.  Skin:     General: Skin is warm and dry.     Capillary Refill: Capillary refill takes less than 2 seconds.  Neurological:     Mental Status: She is alert and oriented to person, place, and time. Mental status is at baseline.  Psychiatric:        Mood and Affect: Mood normal.        Behavior: Behavior normal.     ED Results / Procedures / Treatments   Labs (all labs ordered are listed, but only abnormal results are displayed) Labs Reviewed  RESP PANEL BY RT-PCR (RSV, FLU A&B, COVID)  [id number]  GROUP A STREP BY PCR    EKG None  Radiology No results found.  Procedures Procedures    Medications Ordered in ED Medications  dexamethasone (DECADRON) 10 MG/ML injection for Pediatric ORAL use 10 mg (10 mg Oral Given 05/29/22 2215)    ED Course/ Medical Decision Making/ A&P                             Medical Decision Making  Patient presents to the ED with sore throat and painful swallowing for the past few days.  She states he also has right ear pain with swallowing.  Patient states she is an appointment tomorrow for PCP but is in severe pain.  Denies other URI symptoms.  Exam significant for erythematous posterior oropharynx with tonsillar swelling.  No obvious exudate.  No postnasal drip.  Patient is able to swallow, however, it is extremely painful for her.  Patient is speaking in full sentences.  Lungs are clear to auscultation bilaterally.  Heart rate is mildly tachycardic at 104 with regular rhythm.    I ordered and personally interpreted the following tests: Group A strep negative COVID, RSV, and influenza all negative  Will treat patient's sore through in ED with PO dexamethasone given that she does have erythema, edema, and significant pain with swallowing.  Given that patient's strep test is negative, do not feel antibiotics are warranted at this time.  Patient's symptoms are likely viral etiology.   Discussed supportive care measures for home including staying well hydrated and  drinking cold or warm beverages to soothe the throat.  Advised patient to take Tylenol and ibuprofen as needed for sore throat, but to not start ibuprofen until tomorrow.  Patient will follow-up with PCP tomorrow.    The patient has been appropriately medically screened and/or stabilized in the ED. I have low suspicion for any other emergent medical condition which would require further screening, evaluation or treatment in the ED or require inpatient management. At time of discharge the patient is hemodynamically stable and in no acute distress. I have discussed work-up results and diagnosis with patient and answered all questions.  Patient is agreeable with discharge plan. We discussed strict return precautions for returning to the emergency department and they verbalized understanding.           Final Clinical Impression(s) / ED Diagnoses Final diagnoses:  Acute pharyngitis, unspecified etiology    Rx / DC Orders ED Discharge Orders     None         Lenard Simmer, PA 05/29/22 2235    Charlynne Pander, MD 05/29/22 [phone]

## 2022-05-30 ENCOUNTER — Ambulatory Visit: Payer: Managed Care, Other (non HMO) | Admitting: Family

## 2022-05-30 ENCOUNTER — Encounter: Payer: Self-pay | Admitting: Family

## 2022-05-30 ENCOUNTER — Ambulatory Visit: Payer: Medicaid Other | Admitting: Family

## 2022-05-30 VITALS — BP 138/84 | HR 93 | Temp 98.2°F | Wt 185.0 lb

## 2022-05-30 DIAGNOSIS — J020 Streptococcal pharyngitis: Secondary | ICD-10-CM

## 2022-05-30 DIAGNOSIS — J069 Acute upper respiratory infection, unspecified: Secondary | ICD-10-CM

## 2022-05-30 DIAGNOSIS — J029 Acute pharyngitis, unspecified: Secondary | ICD-10-CM

## 2022-05-30 LAB — POCT INFLUENZA A/B
Influenza A, POC: NEGATIVE
Influenza B, POC: NEGATIVE

## 2022-05-30 LAB — POCT RAPID STREP A (OFFICE): Rapid Strep A Screen: POSITIVE — AB

## 2022-05-30 LAB — POC COVID19 BINAXNOW: SARS Coronavirus 2 Ag: NEGATIVE

## 2022-05-30 MED ORDER — DOXYCYCLINE HYCLATE 100 MG PO TABS: 100.0000 mg | ORAL_TABLET | Freq: Two times a day (BID) | ORAL | 0 refills | Status: DC

## 2022-05-31 ENCOUNTER — Emergency Department (HOSPITAL_COMMUNITY)
Admission: EM | Admit: 2022-05-31 | Discharge: 2022-05-31 | Disposition: A | Discharge status: Home / Self Care | Payer: Managed Care, Other (non HMO) | Attending: Emergency Medicine | Admitting: Emergency Medicine

## 2022-05-31 ENCOUNTER — Other Ambulatory Visit: Payer: Self-pay

## 2022-05-31 DIAGNOSIS — J45909 Unspecified asthma, uncomplicated: Secondary | ICD-10-CM | POA: Insufficient documentation

## 2022-05-31 DIAGNOSIS — J020 Streptococcal pharyngitis: Secondary | ICD-10-CM | POA: Diagnosis not present

## 2022-05-31 DIAGNOSIS — Z91040 Latex allergy status: Secondary | ICD-10-CM | POA: Insufficient documentation

## 2022-05-31 DIAGNOSIS — R059 Cough, unspecified: Secondary | ICD-10-CM | POA: Diagnosis present

## 2022-05-31 MED ORDER — CEFDINIR 300 MG PO CAPS: 300.0000 mg | ORAL_CAPSULE | Freq: Two times a day (BID) | ORAL | 0 refills | Status: AC

## 2022-05-31 MED ORDER — DEXAMETHASONE SODIUM PHOSPHATE 10 MG/ML IJ SOLN
10.0000 mg | Freq: Once | INTRAMUSCULAR | Status: AC
  Administered 2022-05-31: 10 mg via INTRAMUSCULAR
  Filled 2022-05-31: qty 1

## 2022-05-31 MED ORDER — KETOROLAC TROMETHAMINE 30 MG/ML IJ SOLN
30.0000 mg | Freq: Once | INTRAMUSCULAR | Status: AC
  Administered 2022-05-31: 30 mg via INTRAVENOUS
  Filled 2022-05-31: qty 1

## 2022-05-31 NOTE — ED Provider Triage Note (Signed)
Emergency Medicine Provider Triage Evaluation Note  Connie Horton , a 32 y.o. female  was evaluated in triage.  Pt complains of sore throat.  Patient reports she tested positive for strep throat few days ago.  Patient is been taking doxycycline for strep infection as she has a mild allergy to amoxicillin per prior history.  Patient reports that she is having significant pain in throat with swallowing but denies any difficulty swallowing shortness of breath, chest pain, abdominal pain.  Patient was told by her primary care provider to come to the ER for further evaluation.  Review of Systems  Positive: As above Negative: As above  Physical Exam  BP (!) 154/100 (BP Location: Right Arm)   Pulse 95   Temp 99.8 F (37.7 C) (Oral)   Resp 18   Ht 5\' 6"  (1.676 m)   Wt 83.9 kg   LMP 05/25/2022 (Approximate)   SpO2 100%   BMI 29.86 kg/m  Gen:   Awake, no distress   Resp:  Normal effort  MSK:   Moves extremities without difficulty  Other:  Bilateral tonsils are erythematous, 3+, no uvular deviation  Medical Decision Making  Medically screening exam initiated at 2:01 PM.  Appropriate orders placed.  Connie Horton was informed that the remainder of the evaluation will be completed by another provider, this initial triage assessment does not replace that evaluation, and the importance of remaining in the ED until their evaluation is complete.     Connie Heller, PA-C 05/31/22 1402

## 2022-05-31 NOTE — ED Triage Notes (Addendum)
Patient reports she was here Thursday with sore throat, went to pcp Friday and was positive for strep. Patient says her throat hurts very bad now. 10/10. She is on antibiotic for strep but is uncomfortable. Says her pcp directed her to come to ED. Denies any shobr in ED. Patient endorses coughing up brown sputum and history of asthma. States "I am fine right now" in triage.

## 2022-05-31 NOTE — ED Provider Notes (Signed)
[facility] Provider Note   CSN: [id number] Arrival date & time: 05/31/22  1342     History  Chief Complaint  Patient presents with   Sore Throat    Connie Horton is a 32 y.o. female who presents emergency department for persistent sore throat.  She was seen last week for sore throat had a negative strep test but saw her doctor 2 days ago and has a positive point-of-care strep test.  She was treated for strep throat with doxycycline she has had no improvement he has a history of hives to amoxicillin in the past.  She denies voice change or inability to swallow   Sore Throat       Home Medications Prior to Admission medications   Medication Sig Start Date End Date Taking? Authorizing Provider  albuterol (VENTOLIN HFA) 108 (90 Base) MCG/ACT inhaler Inhale 2 puffs into the lungs every 6 (six) hours as needed for wheezing or shortness of breath. 04/22/22   Jaynee Eagles, PA-C  amLODipine (NORVASC) 2.5 MG tablet Take 1 tablet (2.5 mg total) by mouth daily. Patient taking differently: Take 5 mg by mouth daily. 11/12/21   Nafziger, Tommi Rumps, NP  diphenhydrAMINE (BENADRYL) 25 MG tablet Take 25 mg by mouth every 6 (six) hours as needed for itching or allergies.    [provider]  doxycycline (VIBRA-TABS) 100 MG tablet Take 1 tablet (100 mg total) by mouth 2 (two) times daily. 05/30/22   Kennyth Arnold, FNP      Allergies    Citrus, Latex, and Amoxicillin    Review of Systems   Review of Systems  Physical Exam Updated Vital Signs BP (!) 154/100 (BP Location: Right Arm)   Pulse 95   Temp 99.8 F (37.7 C) (Oral)   Resp 18   Ht 5\' 6"  (1.676 m)   Wt 83.9 kg   LMP 05/25/2022 (Approximate)   SpO2 100%   BMI 29.86 kg/m  Physical Exam Vitals and nursing note reviewed.  Constitutional:      General: She is not in acute distress.    Appearance: She is well-developed. She is not diaphoretic.  HENT:     Head: Normocephalic and  atraumatic.     Right Ear: External ear normal.     Left Ear: External ear normal.     Nose: Nose normal.     Mouth/Throat:     Mouth: Mucous membranes are moist.     Pharynx: Uvula midline. Posterior oropharyngeal erythema present.     Tonsils: Tonsillar exudate present. 3+ on the right. 3+ on the left.  Eyes:     General: No scleral icterus.    Conjunctiva/sclera: Conjunctivae normal.  Cardiovascular:     Rate and Rhythm: Normal rate and regular rhythm.     Heart sounds: Normal heart sounds. No murmur heard.    No friction rub. No gallop.  Pulmonary:     Effort: Pulmonary effort is normal. No respiratory distress.     Breath sounds: Normal breath sounds.  Abdominal:     General: Bowel sounds are normal. There is no distension.     Palpations: Abdomen is soft. There is no mass.     Tenderness: There is no abdominal tenderness. There is no guarding.  Musculoskeletal:     Cervical back: Normal range of motion.  Skin:    General: Skin is warm and dry.  Neurological:     Mental Status: She is alert and oriented to person,  place, and time.  Psychiatric:        Behavior: Behavior normal.     ED Results / Procedures / Treatments   Labs (all labs ordered are listed, but only abnormal results are displayed) Labs Reviewed - No data to display  EKG None  Radiology No results found.  Procedures Procedures    Medications Ordered in ED Medications  ketorolac (TORADOL) 30 MG/ML injection 30 mg (has no administration in time range)  dexamethasone (DECADRON) injection 10 mg (has no administration in time range)    ED Course/ Medical Decision Making/ A&P                             Medical Decision Making Risk Prescription drug management.   Patient here with strep throat, no signs of peritonsillar abscess.  Doxycycline does not provide good coverage for strep throat so we will change her to [facility].  She is given a shot of Toradol and Decadron fully this will improve  her symptoms.  She does not appear to have any other significant findings today.  Will have her follow closely with her PCP to come.  Discussed return precautions        Final Clinical Impression(s) / ED Diagnoses Final diagnoses:  None    Rx / DC Orders ED Discharge Orders     None         Margarita Mail, PA-C 05/31/22 1448    Valarie Merino, MD 05/31/22 2243

## 2022-05-31 NOTE — Discharge Instructions (Addendum)
Contact a health care provider if: You have swelling in your neck that keeps getting bigger. You develop a rash, cough, or earache. You cough up a thick mucus that is green, yellow-brown, or bloody. You have pain or discomfort that does not get better with medicine. Your symptoms seem to be getting worse. You have a fever. Get help right away if: You have new symptoms, such as vomiting, severe headache, stiff or painful neck, chest pain, or shortness of breath. You have severe throat pain, drooling, or changes in your voice. You have swelling of the neck, or the skin on the neck becomes red and tender. You have signs of dehydration, such as tiredness (fatigue), dry mouth, and decreased urination. You become increasingly sleepy, or you cannot wake up completely. Your joints become red or painful. These symptoms may represent a serious problem that is an emergency. Do not wait to see if the symptoms will go away. Get medical help right away. Call your local emergency services (911 in the [place] Do not drive yourself to the hospital.

## 2022-06-01 NOTE — Progress Notes (Signed)
Acute Office Visit  Subjective:     Patient ID: Connie Horton, female    DOB: 1991/04/23, 32 y.o.   MRN: [id number]  No chief complaint on file.   HPI Patient is in today with c/o sore throat, painful swallowing, right ear pain body aches, and chills x 3 days and worsening. Patient was seen in the ED yesterday and tested for strep that was negative. She has been taking OTC meds w/o much relief. No meds given at the ED.   Review of Systems  Constitutional:  Positive for chills, fever and malaise/fatigue.  HENT:  Positive for ear pain and sore throat. Negative for congestion.   Musculoskeletal:  Positive for myalgias.  All other systems reviewed and are negative. Past Medical History:  Diagnosis Date  . Asthma   . HTN (hypertension)     Social History   Socioeconomic History  . Marital status: Significant Other    Spouse name: Not on file  . Number of children: Not on file  . Years of education: Not on file  . Highest education level: 12th grade  Occupational History  . Not on file  Tobacco Use  . Smoking status: Never  . Smokeless tobacco: Never  Vaping Use  . Vaping Use: Former  Substance and Sexual Activity  . Alcohol use: Yes    Comment: occassional  . Drug use: Not Currently  . Sexual activity: Yes    Birth control/protection: None  Other Topics Concern  . Not on file  Social History Narrative  . Not on file   Social Determinants of Health   Financial Resource Strain: Low Risk  (10/08/2021)   Overall Financial Resource Strain (CARDIA)   . Difficulty of Paying Living Expenses: Not hard at all  Food Insecurity: No Food Insecurity (10/08/2021)   Hunger Vital Sign   . Worried About Charity fundraiser in the Last Year: Never true   . Ran Out of Food in the Last Year: Never true  Transportation Needs: No Transportation Needs (10/08/2021)   PRAPARE - Transportation   . Lack of Transportation (Medical): No   . Lack of Transportation (Non-Medical): No  Physical  Activity: Insufficiently Active (10/08/2021)   Exercise Vital Sign   . Days of Exercise per Week: 1 day   . Minutes of Exercise per Session: 30 min  Stress: No Stress Concern Present (10/08/2021)   [place]   . Feeling of Stress : Only a little  Social Connections: Socially Isolated (10/08/2021)   Social Connection and Isolation Panel [NHANES]   . Frequency of Communication with Friends and Family: Once a week   . Frequency of Social Gatherings with Friends and Family: Never   . Attends Religious Services: Never   . Active Member of Clubs or Organizations: No   . Attends Archivist Meetings: Not on file   . Marital Status: Never married  Intimate Partner Violence: Not on file    History reviewed. No pertinent surgical history.  Family History  Problem Relation Age of Onset  . CAD Mother   . CVA Mother   . CAD Father   . CVA Father     Allergies  Allergen Reactions  . Citrus Anaphylaxis  . Latex Anaphylaxis  . Amoxicillin Hives and Itching    Current Outpatient Medications on File Prior to Visit  Medication Sig Dispense Refill  . albuterol (VENTOLIN HFA) 108 (90 Base) MCG/ACT inhaler Inhale 2 puffs into  the lungs every 6 (six) hours as needed for wheezing or shortness of breath. 18 g 0  . amLODipine (NORVASC) 2.5 MG tablet Take 1 tablet (2.5 mg total) by mouth daily. (Patient taking differently: Take 5 mg by mouth daily.) 30 tablet 0  . diphenhydrAMINE (BENADRYL) 25 MG tablet Take 25 mg by mouth every 6 (six) hours as needed for itching or allergies.     No current facility-administered medications on file prior to visit.    BP 138/84 (BP Location: Right Arm, Patient Position: Sitting, Cuff Size: Large)   Pulse 93   Temp 98.2 F (36.8 C) (Oral)   Wt 185 lb (83.9 kg)   LMP 05/25/2022 (Approximate)   SpO2 98%   BMI 29.86 kg/m chart      Objective:    BP 138/84 (BP Location: Right Arm, Patient  Position: Sitting, Cuff Size: Large)   Pulse 93   Temp 98.2 F (36.8 C) (Oral)   Wt 185 lb (83.9 kg)   LMP 05/25/2022 (Approximate)   SpO2 98%   BMI 29.86 kg/m    Physical Exam Vitals reviewed.  Constitutional:      Appearance: Normal appearance. She is normal weight.  HENT:     Right Ear: Tympanic membrane, ear canal and external ear normal. There is no impacted cerumen.     Left Ear: Tympanic membrane, ear canal and external ear normal. There is no impacted cerumen.     Nose: Nose normal.     Mouth/Throat:     Mouth: Mucous membranes are moist.     Pharynx: Posterior oropharyngeal erythema present.     Comments: Tonsils 2+, erythema noted. More swelling on the right than left.  Cardiovascular:     Rate and Rhythm: Normal rate and regular rhythm.  Pulmonary:     Effort: Pulmonary effort is normal.     Breath sounds: Normal breath sounds.  Musculoskeletal:        General: Normal range of motion.     Cervical back: Normal range of motion and neck supple.  Skin:    General: Skin is warm and dry.  Neurological:     General: No focal deficit present.     Mental Status: She is alert and oriented to person, place, and time.  Psychiatric:        Mood and Affect: Mood normal.        Behavior: Behavior normal.   Results for orders placed or performed in visit on 05/30/22  POC COVID-19  Result Value Ref Range   SARS Coronavirus 2 Ag Negative Negative  POC Influenza A/B  Result Value Ref Range   Influenza A, POC Negative Negative   Influenza B, POC Negative Negative  POCT rapid strep A  Result Value Ref Range   Rapid Strep A Screen Positive (A) Negative        Assessment & Plan:   Problem List Items Addressed This Visit   None Visit Diagnoses     Upper respiratory tract infection, unspecified type    -  Primary   Relevant Orders   POC COVID-19 (Completed)   POC Influenza A/B (Completed)   Strep pharyngitis       Sore throat       Relevant Orders   POCT rapid  strep A (Completed)       Meds ordered this encounter  Medications  . doxycycline (VIBRA-TABS) 100 MG tablet    Sig: Take 1 tablet (100 mg total) by mouth 2 (two) times  daily.    Dispense:  20 tablet    Refill:  0   Ibuprofen/Tylenol alternating for pain and body aches.  Call the office if symptoms worsen or persist.  No follow-ups on file.  Kennyth Arnold, FNP

## 2022-06-02 NOTE — Progress Notes (Deleted)
Benito Mccreedy D.[facility] [place] Phone: [phone]   Assessment and Plan:     There are no diagnoses linked to this encounter.  ***   Pertinent previous records reviewed include ***   Follow Up: ***     Subjective:   I, Connie Horton, am serving as a Education administrator for Doctor Glennon Mac   Chief Complaint: Right foot pain follow up    HPI:    02/11/21 Patient is a 32 year old female presenting with right foot pain since 02/05/2021. Patient was seen by her PCP on 02/07/21 for this issue and stated that she had started a second job at [employer] and is on her feet a lot. Patient does not recall an actual MOI. Patient states that she has been wearing steel toes boots without any arch support. Patient locates pain to right foot around the arch and in her ankle and describes pain as throbbing in nature. Patient was referred to our office for treatment. Patient states that she has a flat foot so has had pain similar in the past but it is worse now than it has ever had in the past. Pain hurts worse with pressure and wearing a closed toe shoe. Patient states that the best shoe she can wear is her slide, but she knows she should wear something with more support. Patient states today her foot is not as swollen as it has been, but if she walks for too long the foot and toes will swell. Patient has to walk on her tiptoes to avoid the pain but then her toes will start to hurt.    02/21/21 Patient states been non weight bearing because of pain. Right foot. Same pain attempted to walk with inserts. Work note needs to be more specific. Pain on same medial side.   04/08/2022 Patient states that both foot and knee pain has returned she started  new job and has doing more, medial arch pain, when she wakes up in the am it feels like her foot bone is gonna fall out of her foot, pain radiates up her whole leg, knee and hip  specifically, pain is worse when she wakes up in the am , states she hears a popping sound when she flexes the foot    05/06/2022 Patient states that she has gotten better, she does have flares had one last week , stopped meloxicam she is okay right now , has  PT lined up, plantar fascitis support helps, knee hurts all the time     06/03/2022 Patient states    Relevant Historical Information: Pes planus   Additional pertinent review of systems negative.   Current Outpatient Medications:    albuterol (VENTOLIN HFA) 108 (90 Base) MCG/ACT inhaler, Inhale 2 puffs into the lungs every 6 (six) hours as needed for wheezing or shortness of breath., Disp: 18 g, Rfl: 0   amLODipine (NORVASC) 2.5 MG tablet, Take 1 tablet (2.5 mg total) by mouth daily. (Patient taking differently: Take 5 mg by mouth daily.), Disp: 30 tablet, Rfl: 0   cefdinir (OMNICEF) 300 MG capsule, Take 1 capsule (300 mg total) by mouth 2 (two) times daily for 10 days., Disp: 20 capsule, Rfl: 0   diphenhydrAMINE (BENADRYL) 25 MG tablet, Take 25 mg by mouth every 6 (six) hours as needed for itching or allergies., Disp: , Rfl:    doxycycline (VIBRA-TABS) 100 MG tablet, Take 1 tablet (100 mg total) by mouth  2 (two) times daily., Disp: 20 tablet, Rfl: 0   Objective:     There were no vitals filed for this visit.    There is no height or weight on file to calculate BMI.    Physical Exam:    ***   Electronically signed by:  Benito Mccreedy D.[facility] 12:00 PM 06/02/22

## 2022-06-02 NOTE — Telephone Encounter (Signed)
Spoke to the patient and she states that she "feeling better after going to the ED and receiving a different antibiotic".

## 2022-06-03 ENCOUNTER — Ambulatory Visit: Payer: Medicaid Other | Admitting: Sports Medicine

## 2022-12-29 ENCOUNTER — Other Ambulatory Visit: Payer: Self-pay | Admitting: Internal Medicine

## 2022-12-29 DIAGNOSIS — I10 Essential (primary) hypertension: Secondary | ICD-10-CM

## 2023-02-09 ENCOUNTER — Telehealth: Payer: Self-pay | Admitting: Internal Medicine

## 2023-02-09 DIAGNOSIS — I10 Essential (primary) hypertension: Secondary | ICD-10-CM

## 2023-02-09 MED ORDER — AMLODIPINE BESYLATE 2.5 MG PO TABS: 2.5000 mg | ORAL_TABLET | Freq: Every day | ORAL | 0 refills | Status: DC

## 2023-02-09 NOTE — Telephone Encounter (Signed)
Looks like pt needs an appt. Tried to call but no answer. 30 days sent in.

## 2023-02-09 NOTE — Telephone Encounter (Signed)
Prescription Request  02/09/2023  LOV: Visit date not found  What is the name of the medication or equipment?  amLODipine (NORVASC) 2.5 MG tablet  Have you contacted your pharmacy to request a refill? No   Which pharmacy would you like this sent to?  [facility] [address] [place], [place] - [address] [place] [address] Phone: [phone] Fax: [phone]    Patient notified that their request is being sent to the clinical staff for review and that they should receive a response within 2 business days.   Please advise at Mobile [phone] (mobile)

## 2023-02-09 NOTE — Addendum Note (Signed)
Addended by: Waymon Amato R on: 02/09/2023 04:56 PM   Modules accepted: Orders

## 2023-02-19 ENCOUNTER — Encounter: Payer: Self-pay | Admitting: Internal Medicine

## 2023-02-23 MED ORDER — AMLODIPINE BESYLATE 5 MG PO TABS: 5.0000 mg | ORAL_TABLET | Freq: Every day | ORAL | 0 refills | Status: DC

## 2023-02-25 DIAGNOSIS — Z20822 Contact with and (suspected) exposure to covid-19: Secondary | ICD-10-CM | POA: Diagnosis not present

## 2023-02-25 DIAGNOSIS — J029 Acute pharyngitis, unspecified: Secondary | ICD-10-CM | POA: Diagnosis not present

## 2023-02-25 DIAGNOSIS — Z6827 Body mass index (BMI) 27.0-27.9, adult: Secondary | ICD-10-CM | POA: Diagnosis not present

## 2023-02-25 DIAGNOSIS — J069 Acute upper respiratory infection, unspecified: Secondary | ICD-10-CM | POA: Diagnosis not present

## 2023-02-25 DIAGNOSIS — H66002 Acute suppurative otitis media without spontaneous rupture of ear drum, left ear: Secondary | ICD-10-CM | POA: Diagnosis not present

## 2023-03-04 ENCOUNTER — Encounter: Payer: Self-pay | Admitting: Internal Medicine

## 2023-03-10 ENCOUNTER — Encounter: Payer: Self-pay | Admitting: Sports Medicine

## 2023-03-11 NOTE — Progress Notes (Unsigned)
Connie Horton D.[facility] [address] [place] [address] Phone: [phone]   Assessment and Plan:     There are no diagnoses linked to this encounter.  ***   Pertinent previous records reviewed include ***   Follow Up: ***     Subjective:   I, Connie Horton, am serving as a Neurosurgeon for Doctor Richardean Sale   Chief Complaint: Right foot pain follow up    HPI:    02/11/21 Patient is a 32 year old female presenting with right foot pain since 02/05/2021. Patient was seen by her PCP on 02/07/21 for this issue and stated that she had started a second job at [employer] and is on her feet a lot. Patient does not recall an actual MOI. Patient states that she has been wearing steel toes boots without any arch support. Patient locates pain to right foot around the arch and in her ankle and describes pain as throbbing in nature. Patient was referred to our office for treatment. Patient states that she has a flat foot so has had pain similar in the past but it is worse now than it has ever had in the past. Pain hurts worse with pressure and wearing a closed toe shoe. Patient states that the best shoe she can wear is her slide, but she knows she should wear something with more support. Patient states today her foot is not as swollen as it has been, but if she walks for too long the foot and toes will swell. Patient has to walk on her tiptoes to avoid the pain but then her toes will start to hurt.    02/21/21 Patient states been non weight bearing because of pain. Right foot. Same pain attempted to walk with inserts. Work note needs to be more specific. Pain on same medial side.   04/08/2022 Patient states that both foot and knee pain has returned she started  new job and has doing more, medial arch pain, when she wakes up in the am it feels like her foot bone is gonna fall out of her foot, pain radiates up her whole leg, knee and hip  specifically, pain is worse when she wakes up in the am , states she hears a popping sound when she flexes the foot    05/06/2022 Patient states that she has gotten better, she does have flares had one last week , stopped meloxicam she is okay right now , has  PT lined up, plantar fascitis support helps, knee hurts all the time    03/12/2023 Patient states    Relevant Historical Information: Pes planus  Additional pertinent review of systems negative.   Current Outpatient Medications:    albuterol (VENTOLIN HFA) 108 (90 Base) MCG/ACT inhaler, Inhale 2 puffs into the lungs every 6 (six) hours as needed for wheezing or shortness of breath., Disp: 18 g, Rfl: 0   amLODipine (NORVASC) 5 MG tablet, Take 1 tablet (5 mg total) by mouth daily., Disp: 90 tablet, Rfl: 0   diphenhydrAMINE (BENADRYL) 25 MG tablet, Take 25 mg by mouth every 6 (six) hours as needed for itching or allergies., Disp: , Rfl:    doxycycline (VIBRA-TABS) 100 MG tablet, Take 1 tablet (100 mg total) by mouth 2 (two) times daily., Disp: 20 tablet, Rfl: 0   Objective:     There were no vitals filed for this visit.    There is no height or weight on file to calculate BMI.  Physical Exam:    ***   Electronically signed by:  Connie Horton D.[facility] 7:33 AM 03/11/23

## 2023-03-12 ENCOUNTER — Ambulatory Visit (INDEPENDENT_AMBULATORY_CARE_PROVIDER_SITE_OTHER): Payer: Managed Care, Other (non HMO) | Admitting: Sports Medicine

## 2023-03-12 VITALS — BP 132/78 | HR 77 | Ht 66.0 in | Wt 185.0 lb

## 2023-03-12 DIAGNOSIS — M2142 Flat foot [pes planus] (acquired), left foot: Secondary | ICD-10-CM

## 2023-03-12 DIAGNOSIS — M2141 Flat foot [pes planus] (acquired), right foot: Secondary | ICD-10-CM

## 2023-03-12 DIAGNOSIS — M79671 Pain in right foot: Secondary | ICD-10-CM

## 2023-03-12 DIAGNOSIS — G8929 Other chronic pain: Secondary | ICD-10-CM

## 2023-03-12 DIAGNOSIS — M2550 Pain in unspecified joint: Secondary | ICD-10-CM

## 2023-03-12 DIAGNOSIS — M25561 Pain in right knee: Secondary | ICD-10-CM

## 2023-03-12 MED ORDER — MELOXICAM 15 MG PO TABS: 15.0000 mg | ORAL_TABLET | Freq: Every day | ORAL | 0 refills | Status: DC

## 2023-03-12 NOTE — Patient Instructions (Signed)
Work not provided  5-10th - Start meloxicam 15 mg daily x2 weeks.  If still having pain after 2 weeks, complete 3rd-week of meloxicam. May use remaining meloxicam as needed once daily for pain control.  Do not to use additional NSAIDs while taking meloxicam.  May use Tylenol [phone] mg 2 to 3 times a day for breakthrough pain. Labs on the way out  4 week follow up

## 2023-03-13 ENCOUNTER — Other Ambulatory Visit: Payer: Self-pay | Admitting: Sports Medicine

## 2023-03-13 LAB — COMPREHENSIVE METABOLIC PANEL
ALT: 12 U/L (ref 0–35)
AST: 16 U/L (ref 0–37)
Albumin: 4 g/dL (ref 3.5–5.2)
Alkaline Phosphatase: 86 U/L (ref 39–117)
BUN: 11 mg/dL (ref 6–23)
CO2: 25 meq/L (ref 19–32)
Calcium: 9.1 mg/dL (ref 8.4–10.5)
Chloride: 107 meq/L (ref 96–112)
Creatinine, Ser: 0.74 mg/dL (ref 0.40–1.20)
GFR: 106.49 mL/min (ref >60.00)
Glucose, Bld: 95 mg/dL (ref 70–99)
Potassium: 3.8 meq/L (ref 3.5–5.1)
Sodium: 137 meq/L (ref 135–145)
Total Bilirubin: 0.3 mg/dL (ref 0.2–1.2)
Total Protein: 7.5 g/dL (ref 6.0–8.3)

## 2023-03-13 LAB — VITAMIN D 25 HYDROXY (VIT D DEFICIENCY, FRACTURES): VITD: 12.58 ng/mL — ABNORMAL LOW (ref 30.00–100.00)

## 2023-03-13 LAB — CBC WITH DIFFERENTIAL/PLATELET
Basophils Absolute: 0.1 10*3/uL (ref 0.0–0.1)
Basophils Relative: 0.5 % (ref 0.0–3.0)
Eosinophils Absolute: 0.2 10*3/uL (ref 0.0–0.7)
Eosinophils Relative: 1.8 % (ref 0.0–5.0)
HCT: 38.1 % (ref 36.0–46.0)
Hemoglobin: 12.8 g/dL (ref 12.0–15.0)
Lymphocytes Relative: 34.5 % (ref 12.0–46.0)
Lymphs Abs: 3.2 10*3/uL (ref 0.7–4.0)
MCHC: 33.7 g/dL (ref 30.0–36.0)
MCV: 85.9 fL (ref 78.0–100.0)
Monocytes Absolute: 0.8 10*3/uL (ref 0.1–1.0)
Monocytes Relative: 8.7 % (ref 3.0–12.0)
Neutro Abs: 5.1 10*3/uL (ref 1.4–7.7)
Neutrophils Relative %: 54.5 % (ref 43.0–77.0)
Platelets: 353 10*3/uL (ref 150.0–400.0)
RBC: 4.43 Mil/uL (ref 3.87–5.11)
RDW: 12.3 % (ref 11.5–15.5)
WBC: 9.3 10*3/uL (ref 4.0–10.5)

## 2023-03-13 LAB — URIC ACID: Uric Acid, Serum: 3.8 mg/dL (ref 2.4–7.0)

## 2023-03-13 LAB — FERRITIN: Ferritin: 58.2 ng/mL (ref 10.0–291.0)

## 2023-03-13 LAB — SEDIMENTATION RATE: Sed Rate: 36 mm/h — ABNORMAL HIGH (ref 0–20)

## 2023-03-13 LAB — TSH: TSH: <0.01 u[IU]/mL — ABNORMAL LOW (ref 0.35–5.50)

## 2023-03-13 LAB — C-REACTIVE PROTEIN: CRP: <1 mg/dL (ref 0.5–20.0)

## 2023-03-16 LAB — CYCLIC CITRUL PEPTIDE ANTIBODY, IGG: Cyclic Citrullin Peptide Ab: <16 U

## 2023-03-16 LAB — RHEUMATOID FACTOR: Rheumatoid fact SerPl-aCnc: <10 [IU]/mL (ref <14)

## 2023-03-16 LAB — ANA: Anti Nuclear Antibody (ANA): NEGATIVE

## 2023-03-17 ENCOUNTER — Ambulatory Visit (INDEPENDENT_AMBULATORY_CARE_PROVIDER_SITE_OTHER): Payer: Managed Care, Other (non HMO) | Admitting: Internal Medicine

## 2023-03-17 VITALS — BP 120/80 | HR 114 | Temp 98.5°F | Wt 190.1 lb

## 2023-03-17 DIAGNOSIS — R7989 Other specified abnormal findings of blood chemistry: Secondary | ICD-10-CM | POA: Diagnosis not present

## 2023-03-17 NOTE — Progress Notes (Signed)
     Established Patient Office Visit     CC/Reason for Visit: Abnormal TSH  HPI: Connie Horton is a 32 y.o. female who is coming in today for the above mentioned reasons.  She was recently found to have an undetectable TSH after workup done by sports medicine and asked to follow-up with me.  She does not have palpitations, diarrhea, anxiety, tremors.  She does not take biotin supplements.  Past Medical/Surgical History: Past Medical History:  Diagnosis Date   Asthma    HTN (hypertension)     History reviewed. No pertinent surgical history.  Social History:  reports that she has never smoked. She has never used smokeless tobacco. She reports current alcohol use. She reports that she does not currently use drugs.  Allergies: Allergies  Allergen Reactions   Citrus Anaphylaxis   Latex Anaphylaxis   Amoxicillin Hives and Itching    Family History:  Family History  Problem Relation Age of Onset   CAD Mother    CVA Mother    CAD Father    CVA Father      Current Outpatient Medications:    albuterol (VENTOLIN HFA) 108 (90 Base) MCG/ACT inhaler, Inhale 2 puffs into the lungs every 6 (six) hours as needed for wheezing or shortness of breath., Disp: 18 g, Rfl: 0   amLODipine (NORVASC) 5 MG tablet, Take 1 tablet (5 mg total) by mouth daily., Disp: 90 tablet, Rfl: 0   diphenhydrAMINE (BENADRYL) 25 MG tablet, Take 25 mg by mouth every 6 (six) hours as needed for itching or allergies., Disp: , Rfl:    meloxicam (MOBIC) 15 MG tablet, Take 1 tablet (15 mg total) by mouth daily., Disp: 30 tablet, Rfl: 0  Review of Systems:  Negative unless indicated in HPI.   Physical Exam: Vitals:   03/17/23 1537  BP: 120/80  Pulse: (!) 114  Temp: 98.5 F (36.9 C)  TempSrc: Oral  SpO2: 99%  Weight: 190 lb 1.6 oz (86.2 kg)    Body mass index is 30.68 kg/m.   Physical Exam Vitals reviewed.  Constitutional:      Appearance: Normal appearance.  HENT:     Head: Normocephalic and  atraumatic.  Eyes:     Conjunctiva/sclera: Conjunctivae normal.     Pupils: Pupils are equal, round, and reactive to light.  Skin:    General: Skin is warm and dry.  Neurological:     General: No focal deficit present.     Mental Status: She is alert and oriented to person, place, and time.  Psychiatric:        Mood and Affect: Mood normal.        Behavior: Behavior normal.        Thought Content: Thought content normal.        Judgment: Judgment normal.      Impression and Plan:  Low TSH level -     TSH; Future -     T4, free; Future -     T3, free; Future   -She has no symptoms consistent with hyperthyroidism.  Is not taking any biotin supplements.  Recheck TSH, and free T3 and free T4.  Further workup to follow.  Time spent:23 minutes reviewing chart, interviewing and examining patient and formulating plan of care.     Chaya Jan, MD [facility]

## 2023-03-18 LAB — TSH: TSH: <0.01 u[IU]/mL — ABNORMAL LOW (ref 0.35–5.50)

## 2023-03-18 LAB — T4, FREE: Free T4: 1.94 ng/dL — ABNORMAL HIGH (ref 0.60–1.60)

## 2023-03-18 LAB — T3, FREE: T3, Free: 5.4 pg/mL — ABNORMAL HIGH (ref 2.3–4.2)

## 2023-03-19 ENCOUNTER — Other Ambulatory Visit: Payer: Self-pay | Admitting: Internal Medicine

## 2023-03-19 ENCOUNTER — Other Ambulatory Visit: Payer: Self-pay | Admitting: *Deleted

## 2023-03-19 DIAGNOSIS — E0590 Thyrotoxicosis, unspecified without thyrotoxic crisis or storm: Secondary | ICD-10-CM

## 2023-03-19 MED ORDER — METHIMAZOLE 5 MG PO TABS: 5.0000 mg | ORAL_TABLET | Freq: Every day | ORAL | 1 refills | Status: DC

## 2023-03-20 ENCOUNTER — Encounter: Payer: Self-pay | Admitting: Internal Medicine

## 2023-04-08 NOTE — Progress Notes (Unsigned)
Connie Horton D.[facility] [address] [place] [address] Phone: [phone]   Assessment and Plan:     There are no diagnoses linked to this encounter.  ***   Pertinent previous records reviewed include ***    Follow Up: ***     Subjective:   I, Connie Horton, am serving as a Neurosurgeon for Doctor Richardean Sale   Chief Complaint: Right foot pain follow up    HPI:    02/11/21 Patient is a 32 year old female presenting with right foot pain since 02/05/2021. Patient was seen by her PCP on 02/07/21 for this issue and stated that she had started a second job at [employer] and is on her feet a lot. Patient does not recall an actual MOI. Patient states that she has been wearing steel toes boots without any arch support. Patient locates pain to right foot around the arch and in her ankle and describes pain as throbbing in nature. Patient was referred to our office for treatment. Patient states that she has a flat foot so has had pain similar in the past but it is worse now than it has ever had in the past. Pain hurts worse with pressure and wearing a closed toe shoe. Patient states that the best shoe she can wear is her slide, but she knows she should wear something with more support. Patient states today her foot is not as swollen as it has been, but if she walks for too long the foot and toes will swell. Patient has to walk on her tiptoes to avoid the pain but then her toes will start to hurt.    02/21/21 Patient states been non weight bearing because of pain. Right foot. Same pain attempted to walk with inserts. Work note needs to be more specific. Pain on same medial side.   04/08/2022 Patient states that both foot and knee pain has returned she started  new job and has doing more, medial arch pain, when she wakes up in the am it feels like her foot bone is gonna fall out of her foot, pain radiates up her whole leg, knee and hip  specifically, pain is worse when she wakes up in the am , states she hears a popping sound when she flexes the foot    05/06/2022 Patient states that she has gotten better, she does have flares had one last week , stopped meloxicam she is okay right now , has  PT lined up, plantar fascitis support helps, knee hurts all the time     03/12/2023 Patient states that she is in a pain flare . Started 3 days ago, pain is around her ankle , endorses swelling pain radiates up the whole leg to the hip. Decreased ROM . Most of the pain is in her knee and hip   04/09/2023 Patient states   Relevant Historical Information: Pes planus Additional pertinent review of systems negative.   Current Outpatient Medications:    albuterol (VENTOLIN HFA) 108 (90 Base) MCG/ACT inhaler, Inhale 2 puffs into the lungs every 6 (six) hours as needed for wheezing or shortness of breath., Disp: 18 g, Rfl: 0   amLODipine (NORVASC) 5 MG tablet, Take 1 tablet (5 mg total) by mouth daily., Disp: 90 tablet, Rfl: 0   diphenhydrAMINE (BENADRYL) 25 MG tablet, Take 25 mg by mouth every 6 (six) hours as needed for itching or allergies., Disp: , Rfl:    meloxicam (MOBIC) 15 MG  tablet, Take 1 tablet (15 mg total) by mouth daily., Disp: 30 tablet, Rfl: 0   methimazole (TAPAZOLE) 5 MG tablet, Take 1 tablet (5 mg total) by mouth daily., Disp: 90 tablet, Rfl: 1   Objective:     There were no vitals filed for this visit.    There is no height or weight on file to calculate BMI.    Physical Exam:    ***   Electronically signed by:  Connie Horton D.[facility] 7:38 AM 04/08/23

## 2023-04-09 ENCOUNTER — Ambulatory Visit: Payer: Managed Care, Other (non HMO) | Admitting: Sports Medicine

## 2023-04-15 ENCOUNTER — Encounter: Payer: Self-pay | Admitting: Sports Medicine

## 2023-06-10 ENCOUNTER — Encounter: Payer: Self-pay | Admitting: Sports Medicine

## 2023-06-11 ENCOUNTER — Ambulatory Visit: Payer: Managed Care, Other (non HMO) | Admitting: Sports Medicine

## 2023-06-11 ENCOUNTER — Other Ambulatory Visit: Payer: Self-pay

## 2023-06-11 DIAGNOSIS — E0590 Thyrotoxicosis, unspecified without thyrotoxic crisis or storm: Secondary | ICD-10-CM

## 2023-06-15 ENCOUNTER — Other Ambulatory Visit: Payer: Managed Care, Other (non HMO)

## 2023-06-15 ENCOUNTER — Ambulatory Visit: Payer: Managed Care, Other (non HMO) | Admitting: Internal Medicine

## 2023-06-15 ENCOUNTER — Ambulatory Visit: Payer: Managed Care, Other (non HMO)

## 2023-06-15 ENCOUNTER — Ambulatory Visit: Payer: Self-pay | Admitting: Internal Medicine

## 2023-06-15 ENCOUNTER — Telehealth: Payer: Managed Care, Other (non HMO)

## 2023-06-15 VITALS — BP 147/86 | HR 104 | Temp 98.6°F | Ht 66.0 in | Wt 187.0 lb

## 2023-06-15 DIAGNOSIS — R6889 Other general symptoms and signs: Secondary | ICD-10-CM | POA: Diagnosis not present

## 2023-06-15 DIAGNOSIS — R110 Nausea: Secondary | ICD-10-CM

## 2023-06-15 DIAGNOSIS — J0300 Acute streptococcal tonsillitis, unspecified: Secondary | ICD-10-CM

## 2023-06-15 DIAGNOSIS — J45909 Unspecified asthma, uncomplicated: Secondary | ICD-10-CM | POA: Diagnosis not present

## 2023-06-15 DIAGNOSIS — J029 Acute pharyngitis, unspecified: Secondary | ICD-10-CM | POA: Diagnosis not present

## 2023-06-15 LAB — POCT INFLUENZA A/B
Influenza A, POC: NEGATIVE
Influenza B, POC: NEGATIVE

## 2023-06-15 LAB — POCT RAPID STREP A (OFFICE): Rapid Strep A Screen: NEGATIVE

## 2023-06-15 LAB — POC COVID19 BINAXNOW: SARS Coronavirus 2 Ag: NEGATIVE

## 2023-06-15 MED ORDER — BENZONATATE 100 MG PO CAPS: 100.0000 mg | ORAL_CAPSULE | Freq: Three times a day (TID) | ORAL | 0 refills | Status: DC | PRN

## 2023-06-15 MED ORDER — IPRATROPIUM-ALBUTEROL 0.5-2.5 (3) MG/3ML IN SOLN: 3.0000 mL | RESPIRATORY_TRACT | 1 refills | Status: AC | PRN

## 2023-06-15 MED ORDER — PROMETHAZINE-DM 6.25-15 MG/5ML PO SYRP: 5.0000 mL | ORAL_SOLUTION | Freq: Four times a day (QID) | ORAL | 0 refills | Status: DC | PRN

## 2023-06-15 MED ORDER — NEBULIZER/TUBING/MOUTHPIECE KIT: 1.0000 | PACK | 1 refills | Status: AC | PRN

## 2023-06-15 MED ORDER — AZITHROMYCIN 250 MG PO TABS: ORAL_TABLET | ORAL | 0 refills | Status: AC

## 2023-06-15 MED ORDER — ONDANSETRON 4 MG PO TBDP: 4.0000 mg | ORAL_TABLET | Freq: Three times a day (TID) | ORAL | 2 refills | Status: DC | PRN

## 2023-06-15 NOTE — Progress Notes (Deleted)
 Connie Horton [facility] [address] [place] [address] Phone: [phone]   Assessment and Plan:     There are no diagnoses linked to this encounter.  ***   Pertinent previous records reviewed include ***    Follow Up: ***     Subjective:   I, Connie Horton, am serving as a Neurosurgeon for Doctor Richardean Sale   Chief Complaint: Right foot pain follow up    HPI:    02/11/21 Patient is a 33 year old female presenting with right foot pain since 02/05/2021. Patient was seen by her PCP on 02/07/21 for this issue and stated that she had started a second job at [employer] and is on her feet a lot. Patient does not recall an actual MOI. Patient states that she has been wearing steel toes boots without any arch support. Patient locates pain to right foot around the arch and in her ankle and describes pain as throbbing in nature. Patient was referred to our office for treatment. Patient states that she has a flat foot so has had pain similar in the past but it is worse now than it has ever had in the past. Pain hurts worse with pressure and wearing a closed toe shoe. Patient states that the best shoe she can wear is her slide, but she knows she should wear something with more support. Patient states today her foot is not as swollen as it has been, but if she walks for too long the foot and toes will swell. Patient has to walk on her tiptoes to avoid the pain but then her toes will start to hurt.    02/21/21 Patient states been non weight bearing because of pain. Right foot. Same pain attempted to walk with inserts. Work note needs to be more specific. Pain on same medial side.   04/08/2022 Patient states that both foot and knee pain has returned she started  new job and has doing more, medial arch pain, when she wakes up in the am it feels like her foot bone is gonna fall out of her foot, pain radiates up her whole leg, knee and hip  specifically, pain is worse when she wakes up in the am , states she hears a popping sound when she flexes the foot    05/06/2022 Patient states that she has gotten better, she does have flares had one last week , stopped meloxicam she is okay right now , has  PT lined up, plantar fascitis support helps, knee hurts all the time     03/12/2023 Patient states that she is in a pain flare . Started 3 days ago, pain is around her ankle , endorses swelling pain radiates up the whole leg to the hip. Decreased ROM . Most of the pain is in her knee and hip   06/16/2023 Patient states   Relevant Historical Information: Pes planus  Additional pertinent review of systems negative.   Current Outpatient Medications:    albuterol (VENTOLIN HFA) 108 (90 Base) MCG/ACT inhaler, Inhale 2 puffs into the lungs every 6 (six) hours as needed for wheezing or shortness of breath., Disp: 18 g, Rfl: 0   amLODipine (NORVASC) 5 MG tablet, Take 1 tablet (5 mg total) by mouth daily., Disp: 90 tablet, Rfl: 0   diphenhydrAMINE (BENADRYL) 25 MG tablet, Take 25 mg by mouth every 6 (six) hours as needed for itching or allergies., Disp: , Rfl:    meloxicam (MOBIC) 15  MG tablet, Take 1 tablet (15 mg total) by mouth daily., Disp: 30 tablet, Rfl: 0   methimazole (TAPAZOLE) 5 MG tablet, Take 1 tablet (5 mg total) by mouth daily., Disp: 90 tablet, Rfl: 1   Objective:     There were no vitals filed for this visit.    There is no height or weight on file to calculate BMI.    Physical Exam:    ***   Electronically signed by:  Connie Horton [facility] 12:18 PM 06/15/23

## 2023-06-15 NOTE — Progress Notes (Signed)
 ==============================  [facility]: [phone]   -- Medical Office Visit --  Patient: Connie Horton      Age: 33 y.o.       Sex:  female  Date:   06/15/2023 Today's Healthcare Provider: Anthon Kins, MD  ==============================   CHIEF COMPLAINT: Flu-like symptoms (Chills and sweats since Wednesday or Thursday. Has taken flu and cough OTC medication for people with hypertension with temporary relief.), Diarrhea, Emesis (Everytime she eats.), Headache (When coughing.), Cough (Productive, only produced brown mucus over the weekend. Gagging also.), and Sore Throat  SUBJECTIVE: Background This is a 33 y.o. female who has Asthma, mild intermittent and Hypertension on their problem list. History of Present Illness Connie Horton is a 33 year old female with asthma and hypertension who presents with flu-like symptoms and asthma exacerbation.  She presents with flu-like symptoms, including body aches, nausea, and a sore throat that was severe on Friday and Saturday. She was able to eat on Sunday but vomited shortly after, leading to a decreased appetite. Her symptoms began with a cough on Wednesday or Thursday, and she did not go to work on Friday, which she considers day one of her illness. She experiences chest pain when coughing and difficulty lying on her back due to shortness of breath and wheezing. There is a sensation of something stuck in her throat when coughing.  She has a history of asthma, which is currently exacerbated, likely due to the infection. She denies passing out, coughing up blood, or experiencing severe shortness of breath or chest pain, except when coughing. She has not used a nebulizer as an adult but did so as a child for asthma management.  She is not currently taking meloxicam  and is allergic to amoxicillin, penicillin, latex, and citrus. She has been taking high blood pressure medication and over-the-counter cold  and flu remedies.  She works at a hospital experiencing a flu outbreak, which raises her suspicion of having the flu.   Past Medical History - Asthma - Hypertension  Current Outpatient Medications on File Prior to Visit  Medication Sig   albuterol  (VENTOLIN  HFA) 108 (90 Base) MCG/ACT inhaler Inhale 2 puffs into the lungs every 6 (six) hours as needed for wheezing or shortness of breath.   amLODipine  (NORVASC ) 5 MG tablet Take 1 tablet (5 mg total) by mouth daily.   diphenhydrAMINE (BENADRYL) 25 MG tablet Take 25 mg by mouth every 6 (six) hours as needed for itching or allergies.   methimazole  (TAPAZOLE ) 5 MG tablet Take 1 tablet (5 mg total) by mouth daily.   No current facility-administered medications on file prior to visit.   Medications Discontinued During This Encounter  Medication Reason   meloxicam  (MOBIC ) 15 MG tablet Patient Preference      Objective   Physical Exam     06/15/2023    3:24 PM 03/17/2023    3:37 PM 03/12/2023    3:50 PM  Vitals with BMI  Height 5\' 6"   5\' 6"   Weight 187 lbs 190 lbs 2 oz 185 lbs  BMI 30.2 30.7 29.87  Systolic 147 120 098  Diastolic 86 80 78  Pulse 104 114 77   Wt Readings from Last 10 Encounters:  06/15/23 187 lb (84.8 kg)  03/17/23 190 lb 1.6 oz (86.2 kg)  03/12/23 185 lb (83.9 kg)  05/31/22 185 lb (83.9 kg)  05/30/22 185 lb (83.9 kg)  05/29/22 182 lb 15.7 oz (83 kg)  05/06/22 188  lb (85.3 kg)  04/08/22 190 lb (86.2 kg)  01/07/22 186 lb 4.8 oz (84.5 kg)  12/02/21 186 lb 6.4 oz (84.6 kg)   Vital signs reviewed.  Nursing notes reviewed. Weight trend reviewed. Abnormalities and Problem-Specific physical exam findings:  VITALS: T- 98.6 HEENT: Left tonsil swollen. CHEST: Lungs clear to auscultation.  General Appearance:  No acute distress appreciable.   Well-groomed, healthy-appearing female.  Well proportioned with no abnormal fat distribution.  Good muscle tone. Pulmonary:  Normal work of breathing at rest, no respiratory  distress apparent. SpO2: 99 %  Musculoskeletal: All extremities are intact.  Neurological:  Awake, alert, oriented, and engaged.  No obvious focal neurological deficits or cognitive impairments.  Sensorium seems unclouded.   Speech is clear and coherent with logical content. Psychiatric:  Appropriate mood, pleasant and cooperative demeanor, thoughtful and engaged during the exam Physical Exam  Results LABS Influenza test: negative (06/15/2023) Streptococcal test: trace positive (06/15/2023) (COVID) test: negative (06/15/2023) Assessment & Plan Office Visit on 06/15/2023  Component Date Value   SARS Coronavirus 2 Ag 06/15/2023 Negative    Influenza A, POC 06/15/2023 Negative    Influenza B, POC 06/15/2023 Negative    Rapid Strep A Screen 06/15/2023 Negative   Office Visit on 03/17/2023  Component Date Value   T3, Free 03/17/2023 5.4 (H)    Free T4 03/17/2023 1.94 (H)    TSH 03/17/2023 <0.01 (L)   Office Visit on 03/12/2023  Component Date Value   VITD 03/12/2023 12.58 (L)    Uric Acid, Serum 03/12/2023 3.8    TSH 03/12/2023 <0.01 Repeated and verified X2. (L)    Sed Rate 03/12/2023 36 (H)    Rheumatoid fact SerPl-aC* 03/12/2023 <10    Ferritin 03/12/2023 58.2    Cyclic Citrullin Peptide* 03/12/2023 <16    CRP 03/12/2023 <1.0    Sodium 03/12/2023 137    Potassium 03/12/2023 3.8    Chloride 03/12/2023 107    CO2 03/12/2023 25    Glucose, Bld 03/12/2023 95    BUN 03/12/2023 11    Creatinine, Ser 03/12/2023 0.74    Total Bilirubin 03/12/2023 0.3    Alkaline Phosphatase 03/12/2023 86    AST 03/12/2023 16    ALT 03/12/2023 12    Total Protein 03/12/2023 7.5    Albumin 03/12/2023 4.0    GFR 03/12/2023 106.49    Calcium 03/12/2023 9.1    WBC 03/12/2023 9.3    RBC 03/12/2023 4.43    Hemoglobin 03/12/2023 12.8    HCT 03/12/2023 38.1    MCV 03/12/2023 85.9    MCHC 03/12/2023 33.7    RDW 03/12/2023 12.3    Platelets 03/12/2023 353.0    Neutrophils Relative % 03/12/2023 54.5     Lymphocytes Relative 03/12/2023 34.5    Monocytes Relative 03/12/2023 8.7    Eosinophils Relative 03/12/2023 1.8    Basophils Relative 03/12/2023 0.5    Neutro Abs 03/12/2023 5.1    Lymphs Abs 03/12/2023 3.2    Monocytes Absolute 03/12/2023 0.8    Eosinophils Absolute 03/12/2023 0.2    Basophils Absolute 03/12/2023 0.1    Anti Nuclear Antibody (A* 03/12/2023 NEGATIVE   No image results found. No results found.No results found.     Assessment & Plan Strep tonsillitis Uncertain diagnosis associated with Swollen left tonsil with pain and a faint positive strep test confirm strep throat. Due to an amoxicillin allergy, azithromycin  (Z-Pak) is prescribed as an effective alternative. Monitor for worsening symptoms or high fever before starting the antibiotic as  rather avoid if not necessary Most bothersome is feeling of coughing due to something stuck in throat. Moderate asthma without complication, unspecified whether persistent Asthma Exacerbation Current symptoms include wheezing, chest pain when coughing, and difficulty clearing mucus, exacerbated by illness. Nebulizer therapy is preferred over inhalers for breaking up mucus. Nebulizer machines are available over the counter, with insurance typically covering the medication. Prescribe nebulizer treatment with albuterol  and send the prescription to [facility] in [place]. Use the nebulizer every four hours as needed. Recommend sinus rinses with nasal saline sprays. Nausea Zofran  sent  Sore throat Promethazine  diabetes mellitus sent Flu-like symptoms Influenza-like Illness Symptoms include body aches, nausea, cough, and sore throat for three days, likely due to exposure to a flu outbreak at work. Despite a negative flu test, influenza is suspected. Differential diagnosis includes RSV and other viral infections. Limited efficacy of Tamiflu was discussed. Recommend supportive care with fluids and rest. Consider Tamiflu if symptoms  worsen within the next 48 hours. Hypertension Hypertension is currently elevated, likely due to illness and stress. Emphasize the importance of monitoring blood pressure at home and following up with a primary care physician if readings remain elevated. Continue current antihypertensive medications.   General Health Maintenance Provide a work excuse note from February 7th through February 12th.  Follow-up Advise follow-up with a primary care physician if symptoms do not improve or if new symptoms develop.     Orders Placed During this Encounter:   Orders Placed This Encounter  Procedures   For home use only DME Nebulizer machine    Patient needs a nebulizer to treat with the following condition:   COPD (chronic obstructive pulmonary disease) (HCC) [161096]    Length of Need:   Lifetime   POC COVID-19 BinaxNow    Previously tested for COVID-19:   Yes    Resident in a congregate (group) care setting:   No    Employed in healthcare setting:   Yes    Pregnant:   No   POCT Influenza A/B   POCT rapid strep A   Meds ordered this encounter  Medications   azithromycin  (ZITHROMAX ) 250 MG tablet    Sig: Take 2 tablets on day 1, then 1 tablet daily on days 2 through 5    Dispense:  6 tablet    Refill:  0   Respiratory Therapy Supplies (NEBULIZER/TUBING/MOUTHPIECE) KIT    Sig: 1 each by Does not apply route every 4 (four) hours as needed.    Dispense:  1 kit    Refill:  1   ipratropium-albuterol  (DUONEB) 0.5-2.5 (3) MG/3ML SOLN    Sig: Take 3 mLs by nebulization every 4 (four) hours as needed.    Dispense:  120 mL    Refill:  1   benzonatate  (TESSALON  PERLES) 100 MG capsule    Sig: Take 1 capsule (100 mg total) by mouth 3 (three) times daily as needed for cough.    Dispense:  20 capsule    Refill:  0   promethazine -dextromethorphan (PROMETHAZINE -DM) 6.25-15 MG/5ML syrup    Sig: Take 5 mLs by mouth 4 (four) times daily as needed for cough.    Dispense:  118 mL    Refill:  0    ondansetron  (ZOFRAN -ODT) 4 MG disintegrating tablet    Sig: Take 1 tablet (4 mg total) by mouth every 8 (eight) hours as needed for nausea or vomiting.    Dispense:  20 tablet    Refill:  2   Medical  Decision Making: 1 undiagnosed new problem with uncertain prognosis 1 acute illness with systemic symptoms     Ordering of each unique test; Prescription drug management      This document was synthesized by artificial intelligence (Abridge) using HIPAA-compliant recording of the clinical interaction;   We discussed the use of AI scribe software for clinical note transcription with the patient, who gave verbal consent to proceed.    Additional Info: This encounter employed state-of-the-art, real-time, collaborative documentation. The patient actively reviewed and assisted in updating their electronic medical record on a shared screen, ensuring transparency and facilitating joint problem-solving for the problem list, overview, and plan. This approach promotes accurate, informed care. The treatment plan was discussed and reviewed in detail, including medication safety, potential side effects, and all patient questions. We confirmed understanding and comfort with the plan. Follow-up instructions were established, including contacting the office for any concerns, returning if symptoms worsen, persist, or new symptoms develop, and precautions for potential emergency department visits.

## 2023-06-15 NOTE — Telephone Encounter (Signed)
 Copied from CRM [id number]. Topic: Clinical - Red Word Triage >> Jun 15, 2023  9:11 AM Tisa Forester wrote: Red Word that prompted transfer to Nurse Triage: flu like symptoms since thursday or Friday , have not been able to get out of bed just now able to get out of bed today  chest and rib cage hurt , whole body hurts when cough , fatique sweat and chills ,vomiting,Diarrhea comes and goes  requesting flu shot  Chief Complaint: cough Symptoms: cough/coughing spells - sometimes productive -brownish, Wheezing, chest congestion, sore throat, fever, chills, n/v, diarrhea, body aches Frequency:  Approximately Thursday Pertinent Negatives: Patient denies SOB Disposition: [] ED /[] Urgent Care (no appt availability in office) / [] Appointment(In office/virtual)/ [x]  [facility]/ [] Home Care/ [] Refused Recommended Disposition /[] [facility]/ []  Follow-up with PCP Additional Notes: pt stated coughing so much/hard that chest & rib cage area hurt and/or sore from all the coughing.  Reason for Disposition  SEVERE coughing spells (e.g., whooping sound after coughing, vomiting after coughing)  Answer Assessment - Initial Assessment Questions 1. ONSET: "When did the cough begin?"      Approximately Thursday 2. SEVERITY: "How bad is the cough today?"      moderate 3. SPUTUM: "Describe the color of your sputum" (none, dry cough; clear, white, yellow, green)     Brownish  4. HEMOPTYSIS: "Are you coughing up any blood?" If so ask: "How much?" (flecks, streaks, tablespoons, etc.)     no 5. DIFFICULTY BREATHING: "Are you having difficulty breathing?" If Yes, ask: "How bad is it?" (e.g., mild, moderate, severe)    - MILD: No SOB at rest, mild SOB with walking, speaks normally in sentences, can lie down, no retractions, pulse < 100.    - MODERATE: SOB at rest, SOB with minimal exertion and prefers to sit, cannot lie down flat, speaks in phrases, mild retractions, audible wheezing, pulse 100-120.     - SEVERE: Very SOB at rest, speaks in single words, struggling to breathe, sitting hunched forward, retractions, pulse > 120      yes 6. FEVER: "Do you have a fever?" If Yes, ask: "What is your temperature, how was it measured, and when did it start?"     yes 7. CARDIAC HISTORY: "Do you have any history of heart disease?" (e.g., heart attack, congestive heart failure)      High BP 8. LUNG HISTORY: "Do you have any history of lung disease?"  (e.g., pulmonary embolus, asthma, emphysema)    asthma 9. PE RISK FACTORS: "Do you have a history of blood clots?" (or: recent major surgery, recent prolonged travel, bedridden)     no 10. OTHER SYMPTOMS: "Do you have any other symptoms?" (e.g., runny nose, wheezing, chest pain)       Wheezing, chest congestions, sore throat, coughing coughing so much/hard that hurts chest & rib cage area, chills, n/v, diarrhea, body aches 11. PREGNANCY: "Is there any chance you are pregnant?" "When was your last menstrual period?"       No 12. TRAVEL: "Have you traveled out of the country in the last month?" (e.g., travel history, exposures)       N/a  Protocols used: Cough - Acute Non-Productive-A-AH

## 2023-06-15 NOTE — Patient Instructions (Signed)
 VISIT SUMMARY:  Today, we discussed your flu-like symptoms, asthma exacerbation, and hypertension. You have been experiencing body aches, nausea, a sore throat, and a cough, which have impacted your ability to eat and sleep comfortably. We also addressed your asthma symptoms and provided a plan to manage your current health issues.  YOUR PLAN:  -INFLUENZA-LIKE ILLNESS: You have symptoms such as body aches, nausea, cough, and sore throat, likely due to exposure to a flu outbreak at work. Even though your flu test was negative, we suspect you might have the flu. We recommend supportive care with plenty of fluids and rest. If your symptoms worsen within the next 48 hours, we may consider starting you on Tamiflu.  -STREPTOCOCCAL PHARYNGITIS: You have strep throat, confirmed by a faint positive strep test and a swollen left tonsil. Since you are allergic to amoxicillin, we have prescribed azithromycin  (Z-Pak) as an alternative. Please monitor for worsening symptoms or high fever before starting the antibiotic.  -ASTHMA EXACERBATION: Your asthma symptoms, including wheezing and chest pain when coughing, have worsened due to your current illness. We recommend using a nebulizer with albuterol  every four hours as needed to help clear mucus. You can purchase a nebulizer machine over the counter, and your insurance should cover the medication. Additionally, using nasal saline sprays for sinus rinses can help.  -HYPERTENSION: Your blood pressure is currently elevated, likely due to illness and stress. It is important to monitor your blood pressure at home and continue taking your current medications. Please follow up with your primary care physician if your readings remain high.  INSTRUCTIONS:  Please follow up with your primary care physician if your symptoms do not improve or if new symptoms develop. A work excuse note has been provided for you from February 7th through February 12th.

## 2023-06-16 ENCOUNTER — Ambulatory Visit: Payer: Managed Care, Other (non HMO) | Admitting: Sports Medicine

## 2023-06-17 ENCOUNTER — Telehealth: Payer: Self-pay

## 2023-06-17 NOTE — Telephone Encounter (Signed)
Copied from CRM [id number]. Topic: Clinical - Medication Question >> Jun 17, 2023  8:10 AM Deaijah H wrote: Reason for CRM: Patient would like to know if prescriptions azithromycin (ZITHROMAX) 250 MG tablet/benzonatate (TESSALON PERLES) 100 MG capsule/ipratropium-albuterol (DUONEB) 0.5-2.5 (3) MG/3ML SOLN/ondansetron (ZOFRAN-ODT) 4 MG disintegrating tablet/promethazine-dextromethorphan (PROMETHAZINE-DM) 6.25-15 MG/5ML syrup/Respiratory Therapy Supplies (NEBULIZER/TUBING/MOUTHPIECE) KIT could be sent to a closer pharmacy. [facility]  [address] [place] [address] / if needed , please call [phone]  Please see pt msg above concerning Rx from 06/15/23 visit and advise

## 2023-06-18 ENCOUNTER — Ambulatory Visit: Payer: Managed Care, Other (non HMO) | Admitting: "Endocrinology

## 2023-06-18 NOTE — Progress Notes (Deleted)
 [facility] [address] [place] [address] Phone: [phone]   Assessment and Plan:     There are no diagnoses linked to this encounter.  ***   Pertinent previous records reviewed include ***    Follow Up: ***     Subjective:   I, Connie Horton, am serving as a Neurosurgeon for Doctor Richardean Sale   Chief Complaint: Right foot pain follow up    HPI:    02/11/21 Patient is a 33 year old female presenting with right foot pain since 02/05/2021. Patient was seen by her PCP on 02/07/21 for this issue and stated that she had started a second job at [employer] and is on her feet a lot. Patient does not recall an actual MOI. Patient states that she has been wearing steel toes boots without any arch support. Patient locates pain to right foot around the arch and in her ankle and describes pain as throbbing in nature. Patient was referred to our office for treatment. Patient states that she has a flat foot so has had pain similar in the past but it is worse now than it has ever had in the past. Pain hurts worse with pressure and wearing a closed toe shoe. Patient states that the best shoe she can wear is her slide, but she knows she should wear something with more support. Patient states today her foot is not as swollen as it has been, but if she walks for too long the foot and toes will swell. Patient has to walk on her tiptoes to avoid the pain but then her toes will start to hurt.    02/21/21 Patient states been non weight bearing because of pain. Right foot. Same pain attempted to walk with inserts. Work note needs to be more specific. Pain on same medial side.   04/08/2022 Patient states that both foot and knee pain has returned she started  new job and has doing more, medial arch pain, when she wakes up in the am it feels like her foot bone is gonna fall out of her foot, pain radiates up her whole leg, knee and hip  specifically, pain is worse when she wakes up in the am , states she hears a popping sound when she flexes the foot    05/06/2022 Patient states that she has gotten better, she does have flares had one last week , stopped meloxicam she is okay right now , has  PT lined up, plantar fascitis support helps, knee hurts all the time     03/12/2023 Patient states that she is in a pain flare . Started 3 days ago, pain is around her ankle , endorses swelling pain radiates up the whole leg to the hip. Decreased ROM . Most of the pain is in her knee and hip   06/19/2023 Patient states   Relevant Historical Information: Pes planus  Additional pertinent review of systems negative.   Current Outpatient Medications:    albuterol (VENTOLIN HFA) 108 (90 Base) MCG/ACT inhaler, Inhale 2 puffs into the lungs every 6 (six) hours as needed for wheezing or shortness of breath., Disp: 18 g, Rfl: 0   amLODipine (NORVASC) 5 MG tablet, Take 1 tablet (5 mg total) by mouth daily., Disp: 90 tablet, Rfl: 0   azithromycin (ZITHROMAX) 250 MG tablet, Take 2 tablets on day 1, then 1 tablet daily on days 2 through 5, Disp: 6 tablet, Rfl: 0   benzonatate (TESSALON  PERLES) 100 MG capsule, Take 1 capsule (100 mg total) by mouth 3 (three) times daily as needed for cough., Disp: 20 capsule, Rfl: 0   diphenhydrAMINE (BENADRYL) 25 MG tablet, Take 25 mg by mouth every 6 (six) hours as needed for itching or allergies., Disp: , Rfl:    ipratropium-albuterol (DUONEB) 0.5-2.5 (3) MG/3ML SOLN, Take 3 mLs by nebulization every 4 (four) hours as needed., Disp: 120 mL, Rfl: 1   methimazole (TAPAZOLE) 5 MG tablet, Take 1 tablet (5 mg total) by mouth daily., Disp: 90 tablet, Rfl: 1   ondansetron (ZOFRAN-ODT) 4 MG disintegrating tablet, Take 1 tablet (4 mg total) by mouth every 8 (eight) hours as needed for nausea or vomiting., Disp: 20 tablet, Rfl: 2   promethazine-dextromethorphan (PROMETHAZINE-DM) 6.25-15 MG/5ML syrup, Take 5 mLs by mouth 4  (four) times daily as needed for cough., Disp: 118 mL, Rfl: 0   Respiratory Therapy Supplies (NEBULIZER/TUBING/MOUTHPIECE) KIT, 1 each by Does not apply route every 4 (four) hours as needed., Disp: 1 kit, Rfl: 1   Objective:     There were no vitals filed for this visit.    There is no height or weight on file to calculate BMI.    Physical Exam:    ***   Electronically signed by:  Con[facility] 7:40 AM 06/18/23

## 2023-06-19 ENCOUNTER — Ambulatory Visit: Payer: Managed Care, Other (non HMO) | Admitting: Sports Medicine

## 2023-06-22 ENCOUNTER — Ambulatory Visit: Payer: Managed Care, Other (non HMO) | Admitting: Sports Medicine

## 2023-06-24 NOTE — Telephone Encounter (Signed)
Notes reviewed Dr Jon Billings send Rx on 06/15/2023

## 2023-07-06 ENCOUNTER — Ambulatory Visit: Payer: Managed Care, Other (non HMO) | Admitting: Sports Medicine

## 2023-07-06 VITALS — HR 108 | Ht 66.0 in | Wt 195.0 lb

## 2023-07-06 DIAGNOSIS — M2141 Flat foot [pes planus] (acquired), right foot: Secondary | ICD-10-CM

## 2023-07-06 DIAGNOSIS — M25561 Pain in right knee: Secondary | ICD-10-CM | POA: Diagnosis not present

## 2023-07-06 DIAGNOSIS — M722 Plantar fascial fibromatosis: Secondary | ICD-10-CM | POA: Diagnosis not present

## 2023-07-06 DIAGNOSIS — G8929 Other chronic pain: Secondary | ICD-10-CM

## 2023-07-06 DIAGNOSIS — M79671 Pain in right foot: Secondary | ICD-10-CM | POA: Diagnosis not present

## 2023-07-06 DIAGNOSIS — M2142 Flat foot [pes planus] (acquired), left foot: Secondary | ICD-10-CM

## 2023-07-06 MED ORDER — MELOXICAM 15 MG PO TABS: 15.0000 mg | ORAL_TABLET | Freq: Every day | ORAL | 0 refills | Status: DC

## 2023-07-06 NOTE — Progress Notes (Signed)
 Aleen Sells D.[facility] [address] [place] [address] Phone: [phone]   Assessment and Plan:     1. Right foot pain 2. Chronic pain of right knee 3. Pes planus of both feet 4. Plantar fasciitis, right -Chronic with exacerbation, subsequent sports medicine visit - We discussed that patient has intermittent foot pain that often is related to flares of pes planus, however she had increased episodes of flares over the past 3 to 4 months that is more consistent to plantar fasciitis flared with underlying pes planus - Patient is already bought new shoes and new inserts which I do feel will decrease symptoms long-term - Start meloxicam 15 mg daily x2 weeks.  If still having pain after 2 weeks, complete 3rd-week of NSAID. May use remaining NSAID as needed once daily for pain control.  Do not to use additional over-the-counter NSAIDs (ibuprofen, naproxen, Advil, Aleve) while taking prescription NSAIDs.  May use Tylenol [phone] mg 2 to 3 times a day for breakthrough pain. - Start HEP for plan fasciitis - Recommend continuing FMLA that patient could be excused 3-4 times a year, and a flares occur more frequently, patient should be evaluated in clinic.  We can update paperwork   15 additional minutes spent for educating Therapeutic Home Exercise Program.  This included exercises focusing on stretching, strengthening, with focus on eccentric aspects.   Long term goals include an improvement in range of motion, strength, endurance as well as avoiding reinjury. Patient's frequency would include in 1-2 times a day, 3-5 times a week for a duration of 6-12 weeks. Proper technique shown and discussed handout in great detail with ATC.  All questions were discussed and answered.    Pertinent previous records reviewed include none  Follow Up: As needed if no improvement or worsening of symptoms.  Could consider plantar fasciitis CSI   Subjective:   I, Rolland Bimler am a scribe for  Dr. Jean Rosenthal.     Chief Complaint: Right foot pain follow up    HPI:    02/11/21 Patient is a 33 year old female presenting with right foot pain since 02/05/2021. Patient was seen by her PCP on 02/07/21 for this issue and stated that she had started a second job at [employer] and is on her feet a lot. Patient does not recall an actual MOI. Patient states that she has been wearing steel toes boots without any arch support. Patient locates pain to right foot around the arch and in her ankle and describes pain as throbbing in nature. Patient was referred to our office for treatment. Patient states that she has a flat foot so has had pain similar in the past but it is worse now than it has ever had in the past. Pain hurts worse with pressure and wearing a closed toe shoe. Patient states that the best shoe she can wear is her slide, but she knows she should wear something with more support. Patient states today her foot is not as swollen as it has been, but if she walks for too long the foot and toes will swell. Patient has to walk on her tiptoes to avoid the pain but then her toes will start to hurt.    02/21/21 Patient states been non weight bearing because of pain. Right foot. Same pain attempted to walk with inserts. Work note needs to be more specific. Pain on same medial side.   04/08/2022 Patient states that both foot and  knee pain has returned she started  new job and has doing more, medial arch pain, when she wakes up in the am it feels like her foot bone is gonna fall out of her foot, pain radiates up her whole leg, knee and hip specifically, pain is worse when she wakes up in the am , states she hears a popping sound when she flexes the foot    05/06/2022 Patient states that she has gotten better, she does have flares had one last week , stopped meloxicam she is okay right now , has  PT lined up, plantar fascitis support helps, knee hurts all the time      03/12/2023 Patient states that she is in a pain flare . Started 3 days ago, pain is around her ankle , endorses swelling pain radiates up the whole leg to the hip. Decreased ROM . Most of the pain is in her knee and hip   07/06/2023 Patient states today it is ok. Bruising ob the bottom of her feet a few months ago (big toe side). FMLA did not approve the day off because of the bruises. During the colder months she was having more flare ups than her paperwork allows. Changed shoes and it has helped the bruising. Slight pain but not as bad as it was. Wants to discuss modifying paperwork to allow more flare ups during the winter times so that she does not get written up for missing work.    Relevant Historical Information: Pes planus  Additional pertinent review of systems negative.   Current Outpatient Medications:    meloxicam (MOBIC) 15 MG tablet, Take 1 tablet (15 mg total) by mouth daily., Disp: 30 tablet, Rfl: 0   albuterol (VENTOLIN HFA) 108 (90 Base) MCG/ACT inhaler, Inhale 2 puffs into the lungs every 6 (six) hours as needed for wheezing or shortness of breath., Disp: 18 g, Rfl: 0   amLODipine (NORVASC) 5 MG tablet, Take 1 tablet (5 mg total) by mouth daily., Disp: 90 tablet, Rfl: 0   benzonatate (TESSALON PERLES) 100 MG capsule, Take 1 capsule (100 mg total) by mouth 3 (three) times daily as needed for cough., Disp: 20 capsule, Rfl: 0   diphenhydrAMINE (BENADRYL) 25 MG tablet, Take 25 mg by mouth every 6 (six) hours as needed for itching or allergies., Disp: , Rfl:    ipratropium-albuterol (DUONEB) 0.5-2.5 (3) MG/3ML SOLN, Take 3 mLs by nebulization every 4 (four) hours as needed., Disp: 120 mL, Rfl: 1   methimazole (TAPAZOLE) 5 MG tablet, Take 1 tablet (5 mg total) by mouth daily., Disp: 90 tablet, Rfl: 1   ondansetron (ZOFRAN-ODT) 4 MG disintegrating tablet, Take 1 tablet (4 mg total) by mouth every 8 (eight) hours as needed for nausea or vomiting., Disp: 20 tablet, Rfl: 2    promethazine-dextromethorphan (PROMETHAZINE-DM) 6.25-15 MG/5ML syrup, Take 5 mLs by mouth 4 (four) times daily as needed for cough., Disp: 118 mL, Rfl: 0   Respiratory Therapy Supplies (NEBULIZER/TUBING/MOUTHPIECE) KIT, 1 each by Does not apply route every 4 (four) hours as needed., Disp: 1 kit, Rfl: 1   Objective:     Vitals:   07/06/23 1416  Pulse: (!) 108  SpO2: 98%  Weight: 195 lb (88.5 kg)  Height: 5\' 6"  (1.676 m)      Body mass index is 31.47 kg/m.    Physical Exam:    Gen: Appears well, nad, nontoxic and pleasant Psych: Alert and oriented, appropriate mood and affect Neuro: sensation intact, strength is 5/5 with  df/pf/inv/ev, muscle tone wnl Skin: no susupicious lesions or rashes   Right foot/ankle: no deformity, no swelling or effusion TTP anterior medial calcaneus at plantar fascia insertion, proximal plantar fascia NTTP over fibular head, lat mal,   achilles, navicular, base of 5th, ATFL, CFL, deltoid, calcaneous or midfoot ROM DF 30, PF 45, inv/ev intact Negative ant drawer, talar tilt, rotation test, squeeze test. Neg thompson No pain with resisted inversion or eversion  Mild tenderness with resisted dorsiflexion    Electronically signed by:  Aleen Sells D.[facility] 2:59 PM 07/06/23

## 2023-07-06 NOTE — Patient Instructions (Signed)
-   Start meloxicam 15 mg daily x2 weeks.  If still having pain after 2 weeks, complete 3rd-week of NSAID. May use remaining NSAID as needed once daily for pain control.  Do not to use additional over-the-counter NSAIDs (ibuprofen, naproxen, Advil, Aleve) while taking prescription NSAIDs.  May use Tylenol [phone] mg 2 to 3 times a day for breakthrough pain. Foot HEP  As needed follow up

## 2023-07-13 NOTE — Telephone Encounter (Signed)
 FMLA ppwk faxed to [facility] [phone] and sent to scan.

## 2023-07-16 NOTE — Telephone Encounter (Signed)
 Updated paperwork has been sent

## 2023-08-14 ENCOUNTER — Other Ambulatory Visit: Payer: Managed Care, Other (non HMO)

## 2023-08-18 ENCOUNTER — Encounter: Payer: Self-pay | Admitting: "Endocrinology

## 2023-08-18 ENCOUNTER — Ambulatory Visit: Payer: Managed Care, Other (non HMO) | Admitting: "Endocrinology

## 2023-08-18 VITALS — BP 130/80 | HR 86 | Ht 66.0 in | Wt 193.0 lb

## 2023-08-18 DIAGNOSIS — E0590 Thyrotoxicosis, unspecified without thyrotoxic crisis or storm: Secondary | ICD-10-CM

## 2023-08-18 NOTE — Progress Notes (Signed)
 Outpatient Endocrinology Note Connie Yemassee, MD  08/18/23   Connie Horton 12/01/1990 [id number]  Referring Provider: Philip Aspen, Almira Bar* Primary Care Provider: Philip Aspen, Connie Patricia, MD Subjective  No chief complaint on file.   Assessment & Plan  Diagnoses and all orders for this visit:  Hyperthyroidism -     T4, free -     T3, free -     TSH -     TRAb (TSH Receptor Binding Antibody) -     Thyroid stimulating immunoglobulin   Kayte Borchard is currently taking no thyroid medication. Started methimazole 5 mg every day, and stopped due to "couldn't swallow and hair fall", stopped taking it around Jan-feb, 2025. After stopping MMI, swallowing and hair fall normalized. . Patient was biochemically hyperthyroid on last check.  Discussed the etiology for hyperthyroidism. Educated on thyroid axis.  Recommend the following: labs today.  Complications of untreated hyperthyroidism including atrial fibrillation, heart failure and osteoporosis Side effects of Methimazole including but not limited to allergic reaction, rash, bone marrow suppression, liver dysfunction and teratogenic potential  I have reviewed current medications, nurse's notes, allergies, vital signs, past medical and surgical history, family medical history, and social history for this encounter. Counseled patient on symptoms, examination findings, lab findings, imaging results, treatment decisions and monitoring and prognosis. The patient understood the recommendations and agrees with the treatment plan. All questions regarding treatment plan were fully answered.   Return in about 4 weeks (around 09/15/2023) for visit, labs today, labs before next visit.   Connie Colona, MD  08/18/23   I have reviewed current medications, nurse's notes, allergies, vital signs, past medical and surgical history, family medical history, and social history for this encounter. Counseled patient on symptoms, examination  findings, lab findings, imaging results, treatment decisions and monitoring and prognosis. The patient understood the recommendations and agrees with the treatment plan. All questions regarding treatment plan were fully answered.   History of Present Illness Connie Horton is a 33 y.o. year old female who presents to our clinic with hyperthyroidism diagnosed in 03/2023 during "sore throat".    Started methimazole 5 mg every day, and stopped due to "couldn't swallow and hair fall", stopped taking it around Jan-feb, 2025. After stopping MMI, swallowing and hair fall normalized.   Symptoms suggestive of HYPOTHYROIDISM:  fatigue No weight gain No cold intolerance  No constipation  No, once, not now  Symptoms suggestive of HYPERTHYROIDISM:  weight loss  No heat intolerance No hyperdefecation  No palpitations  No  Compressive symptoms:  dysphagia  No dysphonia  No positional dyspnea (especially with simultaneous arms elevation)  No  Smokes  No On biotin  No Personal history of head/neck surgery/irradiation  No  Grave's Ophthalmopathy Clinical Activity Score: 0/9  Physical Exam  BP 130/80   Pulse 86   Ht 5\' 6"  (1.676 m)   Wt 193 lb (87.5 kg)   SpO2 98%   BMI 31.15 kg/m  Constitutional: well developed, well nourished Head: normocephalic, atraumatic, no exophthalmos Eyes: sclera anicteric, no redness Neck: no thyromegaly, no thyroid tenderness; no nodules palpated Lungs: normal respiratory effort Neurology: alert and oriented, no fine hand tremor Skin: dry, no appreciable rashes Musculoskeletal: no appreciable defects Psychiatric: normal mood and affect  Allergies Allergies  Allergen Reactions   Citrus Anaphylaxis and Hives   Latex Anaphylaxis and Itching   Penicillins Anaphylaxis, Hives, Itching and Shortness Of Breath   Amoxicillin Hives and Itching    Current Medications Patient's Medications  New Prescriptions   No medications on file  Previous Medications    ALBUTEROL (VENTOLIN HFA) 108 (90 BASE) MCG/ACT INHALER    Inhale 2 puffs into the lungs every 6 (six) hours as needed for wheezing or shortness of breath.   AMLODIPINE (NORVASC) 5 MG TABLET    Take 1 tablet (5 mg total) by mouth daily.   BENZONATATE (TESSALON PERLES) 100 MG CAPSULE    Take 1 capsule (100 mg total) by mouth 3 (three) times daily as needed for cough.   DIPHENHYDRAMINE (BENADRYL) 25 MG TABLET    Take 25 mg by mouth every 6 (six) hours as needed for itching or allergies.   IPRATROPIUM-ALBUTEROL (DUONEB) 0.5-2.5 (3) MG/3ML SOLN    Take 3 mLs by nebulization every 4 (four) hours as needed.   MELOXICAM (MOBIC) 15 MG TABLET    Take 1 tablet (15 mg total) by mouth daily.   METHIMAZOLE (TAPAZOLE) 5 MG TABLET    Take 1 tablet (5 mg total) by mouth daily.   ONDANSETRON (ZOFRAN-ODT) 4 MG DISINTEGRATING TABLET    Take 1 tablet (4 mg total) by mouth every 8 (eight) hours as needed for nausea or vomiting.   PROMETHAZINE-DEXTROMETHORPHAN (PROMETHAZINE-DM) 6.25-15 MG/5ML SYRUP    Take 5 mLs by mouth 4 (four) times daily as needed for cough.   RESPIRATORY THERAPY SUPPLIES (NEBULIZER/TUBING/MOUTHPIECE) KIT    1 each by Does not apply route every 4 (four) hours as needed.  Modified Medications   No medications on file  Discontinued Medications   No medications on file    Past Medical History Past Medical History:  Diagnosis Date   Asthma    HTN (hypertension)     Past Surgical History History reviewed. No pertinent surgical history.  Family History family history includes CAD in her father and mother; CVA in her father and mother.  Social History Social History   Socioeconomic History   Marital status: Significant Other    Spouse name: Not on file   Number of children: Not on file   Years of education: Not on file   Highest education level: 12th grade  Occupational History   Not on file  Tobacco Use   Smoking status: Never   Smokeless tobacco: Never  Vaping Use   Vaping  status: Former  Substance and Sexual Activity   Alcohol use: Yes    Comment: occassional   Drug use: Not Currently   Sexual activity: Yes    Birth control/protection: None  Other Topics Concern   Not on file  Social History Narrative   Not on file   Social Drivers of Health   Financial Resource Strain: Medium Risk (06/15/2023)   Overall Financial Resource Strain (CARDIA)    Difficulty of Paying Living Expenses: Somewhat hard  Food Insecurity: Food Insecurity Present (06/15/2023)   Hunger Vital Sign    Worried About Running Out of Food in the Last Year: Sometimes true    Ran Out of Food in the Last Year: Sometimes true  Transportation Needs: No Transportation Needs (06/15/2023)   PRAPARE - Administrator, Civil Service (Medical): No    Lack of Transportation (Non-Medical): No  Physical Activity: Insufficiently Active (06/15/2023)   Exercise Vital Sign    Days of Exercise per Week: 3 days    Minutes of Exercise per Session: 10 min  Stress: No Stress Concern Present (06/15/2023)   [employer] of Occupational Health - Occupational Stress Questionnaire    Feeling of Stress : Only  a little  Recent Concern: Stress - Stress Concern Present (03/17/2023)   [employer] of Occupational Health - Occupational Stress Questionnaire    Feeling of Stress : Very much  Social Connections: Socially Isolated (06/15/2023)   Social Connection and Isolation Panel [NHANES]    Frequency of Communication with Friends and Family: More than three times a week    Frequency of Social Gatherings with Friends and Family: More than three times a week    Attends Religious Services: Never    Database administrator or Organizations: No    Attends Engineer, structural: Not on file    Marital Status: Never married  Intimate Partner Violence: Unknown (03/23/2022)   Received from [employer], [facility]   HITS    Physically Hurt: Not on file    Insult or Talk Down To: Not on file     Threaten Physical Harm: Not on file    Scream or Curse: Not on file    Laboratory Investigations Lab Results  Component Value Date   TSH <0.01 (L) 03/17/2023   TSH <0.01 Repeated and verified X2. (L) 03/12/2023   FREET4 1.94 (H) 03/17/2023     No results found for: "TSI"   No components found for: "TRAB"   Lab Results  Component Value Date   CHOL 169 10/09/2021   Lab Results  Component Value Date   HDL 52.10 10/09/2021   Lab Results  Component Value Date   LDLCALC 102 (H) 10/09/2021   Lab Results  Component Value Date   TRIG 76.0 10/09/2021   Lab Results  Component Value Date   CHOLHDL 3 10/09/2021   Lab Results  Component Value Date   CREATININE 0.74 03/12/2023   Lab Results  Component Value Date   GFR 106.49 03/12/2023      Component Value Date/Time   NA 137 03/12/2023 1629   K 3.8 03/12/2023 1629   CL 107 03/12/2023 1629   CO2 25 03/12/2023 1629   GLUCOSE 95 03/12/2023 1629   BUN 11 03/12/2023 1629   CREATININE 0.74 03/12/2023 1629   CALCIUM 9.1 03/12/2023 1629   PROT 7.5 03/12/2023 1629   ALBUMIN 4.0 03/12/2023 1629   AST 16 03/12/2023 1629   ALT 12 03/12/2023 1629   ALKPHOS 86 03/12/2023 1629   BILITOT 0.3 03/12/2023 1629   GFRNONAA >60 11/11/2021 1050      Latest Ref Rng & Units 03/12/2023    4:29 PM 11/11/2021   10:50 AM 10/09/2021    2:58 PM  BMP  Glucose 70 - 99 mg/dL 95  409  88   BUN 6 - 23 mg/dL 11  12  12    Creatinine 0.40 - 1.20 mg/dL 8.11  9.14  7.82   Sodium 135 - 145 mEq/L 137  139  138   Potassium 3.5 - 5.1 mEq/L 3.8  4.3  4.0   Chloride 96 - 112 mEq/L 107  108  106   CO2 19 - 32 mEq/L 25  25  24    Calcium 8.4 - 10.5 mg/dL 9.1  9.3  9.6        Component Value Date/Time   WBC 9.3 03/12/2023 1629   RBC 4.43 03/12/2023 1629   HGB 12.8 03/12/2023 1629   HCT 38.1 03/12/2023 1629   PLT 353.0 03/12/2023 1629   MCV 85.9 03/12/2023 1629   MCH 27.6 11/11/2021 1050   MCHC 33.7 03/12/2023 1629   RDW 12.3 03/12/2023 1629    LYMPHSABS 3.2  03/12/2023 1629   MONOABS 0.8 03/12/2023 1629   EOSABS 0.2 03/12/2023 1629   BASOSABS 0.1 03/12/2023 1629      Parts of this note may have been dictated using voice recognition software. There may be variances in spelling and vocabulary which are unintentional. Not all errors are proofread. Please notify the [facility] if any discrepancies are noted or if the meaning of any statement is not clear.

## 2023-08-21 LAB — T4, FREE: Free T4: 1.6 ng/dL (ref 0.8–1.8)

## 2023-08-21 LAB — TSH: TSH: <0.01 m[IU]/L — ABNORMAL LOW

## 2023-08-21 LAB — TRAB (TSH RECEPTOR BINDING ANTIBODY): TRAB: 2.97 IU/L — ABNORMAL HIGH (ref <=2.00)

## 2023-08-21 LAB — THYROID STIMULATING IMMUNOGLOBULIN: TSI: 119 %{baseline} (ref <140)

## 2023-08-21 LAB — T3, FREE: T3, Free: 4.4 pg/mL — ABNORMAL HIGH (ref 2.3–4.2)

## 2023-08-25 ENCOUNTER — Other Ambulatory Visit: Payer: Self-pay | Admitting: "Endocrinology

## 2023-08-25 DIAGNOSIS — E0500 Thyrotoxicosis with diffuse goiter without thyrotoxic crisis or storm: Secondary | ICD-10-CM

## 2023-08-26 ENCOUNTER — Other Ambulatory Visit: Payer: Self-pay | Admitting: "Endocrinology

## 2023-08-26 DIAGNOSIS — E0590 Thyrotoxicosis, unspecified without thyrotoxic crisis or storm: Secondary | ICD-10-CM

## 2023-08-26 MED ORDER — METHIMAZOLE 5 MG PO TABS
2.5000 mg | ORAL_TABLET | Freq: Every day | ORAL | 0 refills | Status: DC
Start: 2023-08-26 — End: 2023-12-14

## 2023-09-01 ENCOUNTER — Other Ambulatory Visit: Payer: Self-pay

## 2023-09-02 ENCOUNTER — Telehealth (INDEPENDENT_AMBULATORY_CARE_PROVIDER_SITE_OTHER): Admitting: Sports Medicine

## 2023-09-02 DIAGNOSIS — M79671 Pain in right foot: Secondary | ICD-10-CM

## 2023-09-02 DIAGNOSIS — M722 Plantar fascial fibromatosis: Secondary | ICD-10-CM | POA: Diagnosis not present

## 2023-09-02 DIAGNOSIS — M79672 Pain in left foot: Secondary | ICD-10-CM | POA: Diagnosis not present

## 2023-09-02 DIAGNOSIS — M545 Low back pain, unspecified: Secondary | ICD-10-CM | POA: Diagnosis not present

## 2023-09-02 DIAGNOSIS — G8929 Other chronic pain: Secondary | ICD-10-CM

## 2023-09-02 NOTE — Addendum Note (Signed)
 Addended by: Audie Bleacher R on: 09/02/2023 02:44 PM   Modules accepted: Orders

## 2023-09-02 NOTE — Progress Notes (Signed)
 Connie Horton D.[facility] [address] [place] [address] Phone: [phone]    Virtual Visit Note  Assessment and Plan:     1. Pain in both feet 2. Plantar fasciitis, right -Chronic with exacerbation, subsequent visit - Patient continues to have significant flares in right foot pain with new flare of left foot pain.  Still most consistent with plantar fasciitis likely flared by underlying pes planus - Patient has had mild relief with meloxicam  courses, changing to comfortable and supportive tennis shoes with inserts, however she continues to have significant and frequent flares of foot pain every 1 to 2 months - Based on failure to improve despite >6 weeks of conservative therapy, pain >6/10 at times, pain affecting day-to-day activities, recommend further evaluation with MRI of foot - Start Tylenol 500 to 1000 mg tablets 2-3 times a day for day-to-day pain relief - Continue meloxicam  15 mg daily as needed for breakthrough pain.  Recommend limiting chronic NSAIDs 1-2 doses per week - Continue HEP and start physical therapy focusing on foot and ankle, but can include left knee, hip, low back pain that is likely flared due to compensation from foot pain  3. Chronic bilateral low back pain without sciatica -Chronic with exacerbation, subsequent visit - Recurrent flare of low back pain most consistent with altered gait from right foot pain - Continue HEP and start physical therapy   I connected with patient by Doximity video enabled telemedicine application and verified that I am speaking with the correct person using two identifiers. Patient agreed to proceed via telephone. I discussed the limitations of evaluation and management by telemedicine and the availability of in person appointments. The patient expressed understanding and agreed to proceed.  Location: Patient: Home Provider: In office setting  Time of visit 17 minutes, which  included telephone discussion, chart review, treatment plan discussion with patient, and documentation at today's telemedicine visit.  Pertinent previous records reviewed include none  Follow Up: 1 week after MRI to review results and discuss treatment plan which could include plantar fascial CSI if appropriate based on imaging   Subjective:    Chief Complaint: Right foot pain follow up    HPI:    02/11/21 Patient is a 33 year old female presenting with right foot pain since 02/05/2021. Patient was seen by her PCP on 02/07/21 for this issue and stated that she had started a second job at [employer] and is on her feet a lot. Patient does not recall an actual MOI. Patient states that she has been wearing steel toes boots without any arch support. Patient locates pain to right foot around the arch and in her ankle and describes pain as throbbing in nature. Patient was referred to our office for treatment. Patient states that she has a flat foot so has had pain similar in the past but it is worse now than it has ever had in the past. Pain hurts worse with pressure and wearing a closed toe shoe. Patient states that the best shoe she can wear is her slide, but she knows she should wear something with more support. Patient states today her foot is not as swollen as it has been, but if she walks for too long the foot and toes will swell. Patient has to walk on her tiptoes to avoid the pain but then her toes will start to hurt.    02/21/21 Patient states been non weight bearing because of pain. Right foot. Same  pain attempted to walk with inserts. Work note needs to be more specific. Pain on same medial side.   04/08/2022 Patient states that both foot and knee pain has returned she started  new job and has doing more, medial arch pain, when she wakes up in the am it feels like her foot bone is gonna fall out of her foot, pain radiates up her whole leg, knee and hip specifically, pain is worse when she  wakes up in the am , states she hears a popping sound when she flexes the foot    05/06/2022 Patient states that she has gotten better, she does have flares had one last week , stopped meloxicam  she is okay right now , has  PT lined up, plantar fascitis support helps, knee hurts all the time     03/12/2023 Patient states that she is in a pain flare . Started 3 days ago, pain is around her ankle , endorses swelling pain radiates up the whole leg to the hip. Decreased ROM . Most of the pain is in her knee and hip    07/06/2023 Patient states today it is ok. Bruising ob the bottom of her feet a few months ago (big toe side). FMLA did not approve the day off because of the bruises. During the colder months she was having more flare ups than her paperwork allows. Changed shoes and it has helped the bruising. Slight pain but not as bad as it was. Wants to discuss modifying paperwork to allow more flare ups during the winter times so that she does not get written up for missing work.   09/02/23 Recurrent, significant flare of right foot pain over the past several days.  Today is the worst day.  Right foot pain has led to compensation pain in left foot, right knee, right hip, low back.  Patient had to request day off of work due to pain.  Relevant Historical Information: None  Additional pertinent review of systems negative.   Current Outpatient Medications:    albuterol  (VENTOLIN  HFA) 108 (90 Base) MCG/ACT inhaler, Inhale 2 puffs into the lungs every 6 (six) hours as needed for wheezing or shortness of breath., Disp: 18 g, Rfl: 0   amLODipine  (NORVASC ) 5 MG tablet, Take 1 tablet (5 mg total) by mouth daily., Disp: 90 tablet, Rfl: 0   benzonatate  (TESSALON  PERLES) 100 MG capsule, Take 1 capsule (100 mg total) by mouth 3 (three) times daily as needed for cough., Disp: 20 capsule, Rfl: 0   diphenhydrAMINE (BENADRYL) 25 MG tablet, Take 25 mg by mouth every 6 (six) hours as needed for itching or allergies.,  Disp: , Rfl:    ipratropium-albuterol  (DUONEB) 0.5-2.5 (3) MG/3ML SOLN, Take 3 mLs by nebulization every 4 (four) hours as needed., Disp: 120 mL, Rfl: 1   meloxicam  (MOBIC ) 15 MG tablet, Take 1 tablet (15 mg total) by mouth daily., Disp: 30 tablet, Rfl: 0   methimazole  (TAPAZOLE ) 5 MG tablet, Take 0.5 tablets (2.5 mg total) by mouth daily., Disp: 45 tablet, Rfl: 0   ondansetron  (ZOFRAN -ODT) 4 MG disintegrating tablet, Take 1 tablet (4 mg total) by mouth every 8 (eight) hours as needed for nausea or vomiting., Disp: 20 tablet, Rfl: 2   promethazine -dextromethorphan (PROMETHAZINE -DM) 6.25-15 MG/5ML syrup, Take 5 mLs by mouth 4 (four) times daily as needed for cough., Disp: 118 mL, Rfl: 0   Respiratory Therapy Supplies (NEBULIZER/TUBING/MOUTHPIECE) KIT, 1 each by Does not apply route every 4 (four) hours as needed., Disp:  1 kit, Rfl: 1   Objective:    Alert and doing well.  Very pleasant over the phone  Electronically signed by:  Marshall Skeeter D.[facility] 9:12 AM 09/02/23

## 2023-09-02 NOTE — Patient Instructions (Addendum)
 Tylenol [phone] mg 2-3 times a day for pain relief  Meloxicam  for breakthrough pain limit to 1-2 times per week  PT referral  MRI referral  Follow up 1 week after to discuss result

## 2023-09-02 NOTE — Progress Notes (Deleted)
 duplicate

## 2023-09-07 ENCOUNTER — Ambulatory Visit: Admitting: Sports Medicine

## 2023-09-08 ENCOUNTER — Other Ambulatory Visit: Payer: Self-pay

## 2023-09-08 ENCOUNTER — Other Ambulatory Visit: Payer: Self-pay | Admitting: Internal Medicine

## 2023-09-08 ENCOUNTER — Encounter: Payer: Self-pay | Admitting: Internal Medicine

## 2023-09-08 DIAGNOSIS — I10 Essential (primary) hypertension: Secondary | ICD-10-CM

## 2023-09-08 DIAGNOSIS — J4520 Mild intermittent asthma, uncomplicated: Secondary | ICD-10-CM

## 2023-09-08 MED ORDER — AMLODIPINE BESYLATE 5 MG PO TABS: 5.0000 mg | ORAL_TABLET | Freq: Every day | ORAL | 0 refills | Status: AC

## 2023-09-08 MED ORDER — ALBUTEROL SULFATE HFA 108 (90 BASE) MCG/ACT IN AERS: 2.0000 | INHALATION_SPRAY | Freq: Four times a day (QID) | RESPIRATORY_TRACT | 2 refills | Status: AC | PRN

## 2023-09-21 ENCOUNTER — Encounter: Payer: Self-pay | Admitting: Sports Medicine

## 2023-09-21 ENCOUNTER — Other Ambulatory Visit

## 2023-09-21 NOTE — Telephone Encounter (Signed)
 Letter sent via my chart

## 2023-09-24 ENCOUNTER — Ambulatory Visit: Admitting: Physical Therapy

## 2023-09-24 ENCOUNTER — Ambulatory Visit: Admitting: "Endocrinology

## 2023-09-24 NOTE — Therapy (Incomplete)
 OUTPATIENT PHYSICAL THERAPY EVALUATION   Patient Name: Connie Horton MRN: [id number] DOB:Jun 20, 1990, 33 y.o., female Today's Date: 09/24/2023   END OF SESSION:   Past Medical History:  Diagnosis Date   Asthma    HTN (hypertension)    No past surgical history on file. Patient Active Problem List   Diagnosis Date Noted   Hypertension 10/09/2021   Asthma, mild intermittent 04/18/2020    PCP: Zilphia Hilt, Charyl Coppersmith, MD  REFERRING PROVIDER: Ulysees Gander, DO  REFERRING DIAG: Pain in both feet; Plantar fasciitis, right; Chronic bilateral low back pain without sciatica  Rationale for Evaluation and Treatment: Rehabilitation  THERAPY DIAG:  No diagnosis found.  ONSET DATE: ***   SUBJECTIVE:         SUBJECTIVE STATEMENT: ***  PERTINENT HISTORY:  See PMH above  PAIN:  Are you having pain? Yes:  NPRS scale: *** Pain location: *** Pain description: *** Aggravating factors: *** Relieving factors: ***  PRECAUTIONS: None  RED FLAGS: None   WEIGHT BEARING RESTRICTIONS: No  FALLS:  Has patient fallen in last 6 months? {fallsyesno:27318}  LIVING ENVIRONMENT: Lives with: {[facility] lives with:[address]69::"lives with their family"} Lives in: {Lives in:[address]} Stairs: {opstairs:27293} Has following equipment at home: {Assistive devices:23999}  OCCUPATION: ***  PLOF: Independent  PATIENT GOALS: ***   OBJECTIVE:  Note: Objective measures were completed at Evaluation unless otherwise noted. PATIENT SURVEYS:  {rehab surveys:24030}  COGNITION: Overall cognitive status: Within functional limits for tasks assessed    SENSATION: WFL  MUSCLE LENGTH: ***  POSTURE:   ***  PALPATION: ***  LUMBAR ROM:   AROM eval  Flexion   Extension   Right lateral flexion   Left lateral flexion   Right rotation   Left rotation    (Blank rows = not tested)  LOWER EXTREMITY ROM:     {AROM/PROM:27142}  Right eval Left eval  Hip flexion    Hip extension    Hip  abduction    Hip adduction    Hip internal rotation    Hip external rotation    Knee flexion    Knee extension    Ankle dorsiflexion    Ankle plantarflexion    Ankle inversion    Ankle eversion     (Blank rows = not tested)  LOWER EXTREMITY MMT:    MMT Right eval Left eval  Hip flexion    Hip extension    Hip abduction    Hip adduction    Hip internal rotation    Hip external rotation    Knee flexion    Knee extension    Ankle dorsiflexion    Ankle plantarflexion    Ankle inversion    Ankle eversion     (Blank rows = not tested)  LUMBAR SPECIAL TESTS:  {lumbar special test:25242}  FUNCTIONAL TESTS:  {Functional tests:24029}  GAIT: Distance walked: *** Assistive device utilized: {Assistive devices:23999} Level of assistance: {Levels of assistance:24026} Comments: ***   TREATMENT  [facility] Adult PT Treatment:                                                DATE: 09/24/2023 ***  PATIENT EDUCATION:  Education details: Exam findings, POC, HEP Person educated: Patient Education method: Explanation, Demonstration, Tactile cues, Verbal cues, and Handouts Education comprehension: verbalized understanding, returned demonstration, verbal cues required, tactile cues required, and needs further education  HOME EXERCISE PROGRAM: ***   ASSESSMENT: CLINICAL IMPRESSION: Patient is a *** y.o. *** who was seen today for physical therapy evaluation and treatment for ***.   OBJECTIVE IMPAIRMENTS: {opptimpairments:25111}.   ACTIVITY LIMITATIONS: {activitylimitations:27494}  PARTICIPATION LIMITATIONS: {participationrestrictions:25113}  PERSONAL FACTORS: {Personal factors:25162} are also affecting patient's functional outcome.   REHAB POTENTIAL: {rehabpotential:25112}  CLINICAL DECISION MAKING: {clinical decision making:25114}  EVALUATION COMPLEXITY: {Evaluation complexity:25115}   GOALS: Goals reviewed with patient? Yes  SHORT TERM GOALS: Target date:  10/22/2023  Patient will be I with initial HEP in order to progress with therapy. Baseline: HEP provided at eval Goal status: INITIAL  2.  *** Baseline:  Goal status: INITIAL  3.  *** Baseline:  Goal status: INITIAL  LONG TERM GOALS: Target date: 11/19/2023  Patient will be I with final HEP to maintain progress from PT. Baseline: HEP provided at eval Goal status: INITIAL  2.  *** Baseline:  Goal status: INITIAL  3.  *** Baseline:  Goal status: INITIAL  4.  *** Baseline:  Goal status: INITIAL   PLAN: PT FREQUENCY: {rehab frequency:25116}  PT DURATION: {rehab duration:25117}  PLANNED INTERVENTIONS: {rehab planned interventions:25118::"97110-Therapeutic exercises","97530- Therapeutic [phone]- Neuromuscular re-education","97535- Self JXBJ","47829- Manual therapy"}.  PLAN FOR NEXT SESSION: Review HEP and progress PRN, ***    Leah Primus, PT, DPT, LAT, ATC 09/24/23  12:44 PM Phone: [phone] Fax: [phone]

## 2023-10-05 ENCOUNTER — Ambulatory Visit
Admission: EM | Admit: 2023-10-05 | Discharge: 2023-10-05 | Disposition: A | Discharge status: Home / Self Care | Attending: Family Medicine | Admitting: Family Medicine

## 2023-10-05 DIAGNOSIS — Z113 Encounter for screening for infections with a predominantly sexual mode of transmission: Secondary | ICD-10-CM | POA: Insufficient documentation

## 2023-10-05 DIAGNOSIS — N898 Other specified noninflammatory disorders of vagina: Secondary | ICD-10-CM | POA: Diagnosis not present

## 2023-10-05 NOTE — ED Triage Notes (Signed)
 Patient is here for STI-testing. Denies ay symptoms however partner has penile discharge.

## 2023-10-05 NOTE — ED Provider Notes (Signed)
 [facility]  Note:  This document was prepared using Conservation officer, historic buildings and may include unintentional dictation errors.  MRN: [id number] DOB: 1990-08-12  Subjective:   Connie Horton is a 33 y.o. female presenting for possible exposure to sexually transmitted infection.  Reports that her partner of 7 years is having penile discharge.  The patient reports that she was also having clear vaginal discharge but is typical for her when her cycle is coming.  Also reports that she had some malodor.  Has a history of BV and yeast infection.  Denies fever, n/v, abdominal pain, pelvic pain, rashes, dysuria, urinary frequency, hematuria.    No current facility-administered medications for this encounter.  Current Outpatient Medications:    amLODipine  (NORVASC ) 5 MG tablet, Take 1 tablet (5 mg total) by mouth daily., Disp: 90 tablet, Rfl: 0   methimazole  (TAPAZOLE ) 5 MG tablet, Take 0.5 tablets (2.5 mg total) by mouth daily., Disp: 45 tablet, Rfl: 0   albuterol  (VENTOLIN  HFA) 108 (90 Base) MCG/ACT inhaler, Inhale 2 puffs into the lungs every 6 (six) hours as needed for wheezing or shortness of breath., Disp: 18 g, Rfl: 2   amLODipine  (NORVASC ) 5 MG tablet, Take 1 tablet (5 mg total) by mouth daily., Disp: 90 tablet, Rfl: 0   benzonatate  (TESSALON  PERLES) 100 MG capsule, Take 1 capsule (100 mg total) by mouth 3 (three) times daily as needed for cough., Disp: 20 capsule, Rfl: 0   diphenhydrAMINE (BENADRYL) 25 MG tablet, Take 25 mg by mouth every 6 (six) hours as needed for itching or allergies., Disp: , Rfl:    ipratropium-albuterol  (DUONEB) 0.5-2.5 (3) MG/3ML SOLN, Take 3 mLs by nebulization every 4 (four) hours as needed., Disp: 120 mL, Rfl: 1   meloxicam  (MOBIC ) 15 MG tablet, Take 1 tablet (15 mg total) by mouth daily., Disp: 30 tablet, Rfl: 0   ondansetron  (ZOFRAN -ODT) 4 MG disintegrating tablet, Take 1 tablet (4 mg total) by mouth every 8 (eight) hours as needed for  nausea or vomiting., Disp: 20 tablet, Rfl: 2   promethazine -dextromethorphan (PROMETHAZINE -DM) 6.25-15 MG/5ML syrup, Take 5 mLs by mouth 4 (four) times daily as needed for cough., Disp: 118 mL, Rfl: 0   Respiratory Therapy Supplies (NEBULIZER/TUBING/MOUTHPIECE) KIT, 1 each by Does not apply route every 4 (four) hours as needed., Disp: 1 kit, Rfl: 1   Allergies  Allergen Reactions   Citrus Anaphylaxis and Hives   Latex Anaphylaxis and Itching   Penicillins Anaphylaxis, Hives, Itching and Shortness Of Breath   Amoxicillin Hives and Itching    Past Medical History:  Diagnosis Date   Asthma    HTN (hypertension)      History reviewed. No pertinent surgical history.  Family History  Problem Relation Age of Onset   CAD Mother    CVA Mother    CAD Father    CVA Father     Social History   Tobacco Use   Smoking status: Never   Smokeless tobacco: Never  Vaping Use   Vaping status: Former  Substance Use Topics   Alcohol use: Yes    Comment: occassional   Drug use: Not Currently    ROS   Objective:   Vitals: BP (!) 166/101 (BP Location: Left Arm)   Pulse 76   Temp 98.7 F (37.1 C) (Oral)   Resp 16   LMP 10/05/2023 (Approximate)   SpO2 95%   Physical Exam Constitutional:      General: She is not in  acute distress.    Appearance: Normal appearance. She is well-developed. She is not ill-appearing, toxic-appearing or diaphoretic.  HENT:     Head: Normocephalic and atraumatic.     Right Ear: External ear normal.     Left Ear: External ear normal.     Nose: Nose normal.     Mouth/Throat:     Mouth: Mucous membranes are moist.  Eyes:     General: No scleral icterus.       Right eye: No discharge.        Left eye: No discharge.     Extraocular Movements: Extraocular movements intact.  Cardiovascular:     Rate and Rhythm: Normal rate.  Pulmonary:     Effort: Pulmonary effort is normal.  Skin:    General: Skin is warm and dry.  Neurological:     General: No  focal deficit present.     Mental Status: She is alert and oriented to person, place, and time.  Psychiatric:        Mood and Affect: Mood normal.        Behavior: Behavior normal.        Thought Content: Thought content normal.        Judgment: Judgment normal.     Assessment and Plan :   PDMP not reviewed this encounter.  1. Vaginal discharge   2. Screen for STD (sexually transmitted disease)    Patient is currently on her cycle, no concern for pregnancy.  STI check pending, will treat as appropriate based off of lab results.     Adolph Hoop, PA-C 10/05/23 1447

## 2023-10-05 NOTE — Discharge Instructions (Signed)
 We will treat you for the infection(s) you test positive for. These results should be available tomorrow and our results nurse will reach out to you to confirm these details.

## 2023-10-06 LAB — RPR: RPR Ser Ql: NONREACTIVE

## 2023-10-06 LAB — HIV ANTIBODY (ROUTINE TESTING W REFLEX): HIV Screen 4th Generation wRfx: NONREACTIVE

## 2023-10-08 ENCOUNTER — Telehealth: Payer: Self-pay | Admitting: Nurse Practitioner

## 2023-10-08 ENCOUNTER — Telehealth: Payer: Self-pay

## 2023-10-08 DIAGNOSIS — B9689 Other specified bacterial agents as the cause of diseases classified elsewhere: Secondary | ICD-10-CM

## 2023-10-08 LAB — CERVICOVAGINAL ANCILLARY ONLY
Bacterial Vaginitis (gardnerella): POSITIVE — AB
Candida Glabrata: NEGATIVE
Candida Vaginitis: NEGATIVE
Chlamydia: NEGATIVE
Comment: NEGATIVE
Comment: NEGATIVE
Comment: NEGATIVE
Comment: NEGATIVE
Comment: NEGATIVE
Comment: NORMAL
Neisseria Gonorrhea: NEGATIVE
Trichomonas: NEGATIVE

## 2023-10-08 MED ORDER — METRONIDAZOLE 500 MG PO TABS: 500.0000 mg | ORAL_TABLET | Freq: Two times a day (BID) | ORAL | 0 refills | Status: AC

## 2023-10-08 NOTE — Telephone Encounter (Signed)
 Patient seen in June 2 for vaginal discharge.  Vaginal swab positive for BV.  Allergies reviewed.  Metronidazole  sent to pharmacy.  Nursing staff to inform patient.

## 2023-10-08 NOTE — Telephone Encounter (Signed)
 Called patient to inform about medication at pharmacy.

## 2023-10-08 NOTE — Telephone Encounter (Signed)
 TC from patient requesting abx sent to pharmacy for positive BV. Pharmacy and allergies verified.

## 2023-10-09 ENCOUNTER — Ambulatory Visit (HOSPITAL_COMMUNITY): Payer: Self-pay

## 2023-10-13 ENCOUNTER — Ambulatory Visit: Admitting: Physical Therapy

## 2023-10-13 NOTE — Therapy (Incomplete)
 OUTPATIENT PHYSICAL THERAPY EVALUATION   Patient Name: Connie Horton MRN: [id number] DOB:1990/10/04, 33 y.o., female Today's Date: 10/13/2023   END OF SESSION:   Past Medical History:  Diagnosis Date   Asthma    HTN (hypertension)    No past surgical history on file. Patient Active Problem List   Diagnosis Date Noted   Hypertension 10/09/2021   Asthma, mild intermittent 04/18/2020    PCP: Zilphia Hilt, Charyl Coppersmith, MD  REFERRING PROVIDER: Ulysees Gander, DO  REFERRING DIAG: Pain in both feet; Plantar fasciitis, right; Chronic bilateral low back pain without sciatica  Rationale for Evaluation and Treatment: Rehabilitation  THERAPY DIAG:  No diagnosis found.  ONSET DATE: ***   SUBJECTIVE:         SUBJECTIVE STATEMENT: ***  PERTINENT HISTORY:  See PMH above  PAIN:  Are you having pain? Yes:  NPRS scale: *** Pain location: *** Pain description: *** Aggravating factors: *** Relieving factors: ***  PRECAUTIONS: None  RED FLAGS: None   WEIGHT BEARING RESTRICTIONS: No  FALLS:  Has patient fallen in last 6 months? {fallsyesno:27318}  LIVING ENVIRONMENT: Lives with: {[facility] lives with:25569::"lives with their family"} Lives in: {Lives in:[address]} Stairs: {opstairs:27293} Has following equipment at home: {Assistive devices:23999}  OCCUPATION: ***  PLOF: Independent  PATIENT GOALS: ***   OBJECTIVE:  Note: Objective measures were completed at Evaluation unless otherwise noted. PATIENT SURVEYS:  {rehab surveys:24030}  COGNITION: Overall cognitive status: Within functional limits for tasks assessed    SENSATION: WFL  MUSCLE LENGTH: ***  POSTURE:   ***  PALPATION: ***  LUMBAR ROM:   AROM eval  Flexion   Extension   Right lateral flexion   Left lateral flexion   Right rotation   Left rotation    (Blank rows = not tested)  LOWER EXTREMITY ROM:     {AROM/PROM:27142}  Right eval Left eval  Hip flexion    Hip extension    Hip  abduction    Hip adduction    Hip internal rotation    Hip external rotation    Knee flexion    Knee extension    Ankle dorsiflexion    Ankle plantarflexion    Ankle inversion    Ankle eversion     (Blank rows = not tested)  LOWER EXTREMITY MMT:    MMT Right eval Left eval  Hip flexion    Hip extension    Hip abduction    Hip adduction    Hip internal rotation    Hip external rotation    Knee flexion    Knee extension    Ankle dorsiflexion    Ankle plantarflexion    Ankle inversion    Ankle eversion     (Blank rows = not tested)  LUMBAR SPECIAL TESTS:  {lumbar special test:25242}  FUNCTIONAL TESTS:  {Functional tests:24029}  GAIT: Distance walked: *** Assistive device utilized: {Assistive devices:23999} Level of assistance: {Levels of assistance:24026} Comments: ***   TREATMENT  [facility] Adult PT Treatment:                                                DATE: 10/13/2023 ***  PATIENT EDUCATION:  Education details: Exam findings, POC, HEP Person educated: Patient Education method: Explanation, Demonstration, Tactile cues, Verbal cues, and Handouts Education comprehension: verbalized understanding, returned demonstration, verbal cues required, tactile cues required, and needs further education  HOME EXERCISE PROGRAM: ***   ASSESSMENT: CLINICAL IMPRESSION: Patient is a 33 y.o. female who was seen today for physical therapy evaluation and treatment for ***.   OBJECTIVE IMPAIRMENTS: {opptimpairments:25111}.   ACTIVITY LIMITATIONS: {activitylimitations:27494}  PARTICIPATION LIMITATIONS: {participationrestrictions:25113}  PERSONAL FACTORS: {Personal factors:25162} are also affecting patient's functional outcome.   REHAB POTENTIAL: {rehabpotential:25112}  CLINICAL DECISION MAKING: {clinical decision making:25114}  EVALUATION COMPLEXITY: {Evaluation complexity:25115}   GOALS: Goals reviewed with patient? Yes  SHORT TERM GOALS: Target date:  11/10/2023  Patient will be I with initial HEP in order to progress with therapy. Baseline: HEP provided at eval Goal status: INITIAL  2.  *** Baseline:  Goal status: INITIAL  3.  *** Baseline:  Goal status: INITIAL  LONG TERM GOALS: Target date: 12/08/2023  Patient will be I with final HEP to maintain progress from PT. Baseline: HEP provided at eval Goal status: INITIAL  2.  *** Baseline:  Goal status: INITIAL  3.  *** Baseline:  Goal status: INITIAL  4.  *** Baseline:  Goal status: INITIAL   PLAN: PT FREQUENCY: {rehab frequency:25116}  PT DURATION: {rehab duration:25117}  PLANNED INTERVENTIONS: {rehab planned interventions:25118::"97110-Therapeutic exercises","97530- Therapeutic [phone]- Neuromuscular re-education","97535- Self JXBJ","47829- Manual therapy"}.  PLAN FOR NEXT SESSION: Review HEP and progress PRN, ***    Leah Primus, PT, DPT, LAT, ATC 10/13/23  8:00 AM Phone: [phone] Fax: [phone]

## 2023-10-15 ENCOUNTER — Encounter: Payer: Self-pay | Admitting: Sports Medicine

## 2023-11-03 NOTE — Telephone Encounter (Signed)
 Updated and re sent

## 2023-11-10 ENCOUNTER — Encounter: Payer: Self-pay | Admitting: Sports Medicine

## 2023-11-22 ENCOUNTER — Encounter: Payer: Self-pay | Admitting: Internal Medicine

## 2023-11-23 NOTE — Telephone Encounter (Signed)
Messaged patient via mychart

## 2023-12-01 ENCOUNTER — Encounter: Payer: Self-pay | Admitting: Internal Medicine

## 2023-12-02 ENCOUNTER — Ambulatory Visit (INDEPENDENT_AMBULATORY_CARE_PROVIDER_SITE_OTHER): Admitting: Internal Medicine

## 2023-12-02 ENCOUNTER — Encounter: Payer: Self-pay | Admitting: Internal Medicine

## 2023-12-02 VITALS — BP 120/80 | HR 88 | Temp 98.3°F | Wt 194.3 lb

## 2023-12-02 DIAGNOSIS — R202 Paresthesia of skin: Secondary | ICD-10-CM | POA: Diagnosis not present

## 2023-12-02 DIAGNOSIS — R200 Anesthesia of skin: Secondary | ICD-10-CM

## 2023-12-02 DIAGNOSIS — E0590 Thyrotoxicosis, unspecified without thyrotoxic crisis or storm: Secondary | ICD-10-CM | POA: Diagnosis not present

## 2023-12-02 NOTE — Progress Notes (Signed)
 Established Patient Office Visit     CC/Reason for Visit: I feel weird, feet and hands tingling  HPI: Connie Horton is a 33 y.o. female who is coming in today for the above mentioned reasons. Past Medical History is significant for: Hyperthyroidism on methimazole  5 mg.  Has not followed recently with endocrinology.  For the last few weeks she has been feeling tingling of her hands and feet.  Feels like I am hanging upside down on the monkey bars.   Past Medical/Surgical History: Past Medical History:  Diagnosis Date   Asthma    HTN (hypertension)     History reviewed. No pertinent surgical history.  Social History:  reports that she has never smoked. She has never used smokeless tobacco. She reports current alcohol use. She reports that she does not currently use drugs.  Allergies: Allergies  Allergen Reactions   Citrus Anaphylaxis and Hives   Latex Anaphylaxis and Itching   Penicillins Anaphylaxis, Hives, Itching and Shortness Of Breath   Amoxicillin Hives and Itching    Family History:  Family History  Problem Relation Age of Onset   CAD Mother    CVA Mother    CAD Father    CVA Father      Current Outpatient Medications:    albuterol  (VENTOLIN  HFA) 108 (90 Base) MCG/ACT inhaler, Inhale 2 puffs into the lungs every 6 (six) hours as needed for wheezing or shortness of breath., Disp: 18 g, Rfl: 2   amLODipine  (NORVASC ) 5 MG tablet, Take 1 tablet (5 mg total) by mouth daily., Disp: 90 tablet, Rfl: 0   amLODipine  (NORVASC ) 5 MG tablet, Take 1 tablet (5 mg total) by mouth daily., Disp: 90 tablet, Rfl: 0   diphenhydrAMINE (BENADRYL) 25 MG tablet, Take 25 mg by mouth every 6 (six) hours as needed for itching or allergies., Disp: , Rfl:    ipratropium-albuterol  (DUONEB) 0.5-2.5 (3) MG/3ML SOLN, Take 3 mLs by nebulization every 4 (four) hours as needed., Disp: 120 mL, Rfl: 1   meloxicam  (MOBIC ) 15 MG tablet, Take 1 tablet (15 mg total) by mouth daily., Disp: 30  tablet, Rfl: 0   methimazole  (TAPAZOLE ) 5 MG tablet, Take 0.5 tablets (2.5 mg total) by mouth daily., Disp: 45 tablet, Rfl: 0   metroNIDAZOLE  (FLAGYL ) 500 MG tablet, Take 1 tablet (500 mg total) by mouth 2 (two) times daily., Disp: 14 tablet, Rfl: 0   Respiratory Therapy Supplies (NEBULIZER/TUBING/MOUTHPIECE) KIT, 1 each by Does not apply route every 4 (four) hours as needed., Disp: 1 kit, Rfl: 1   ondansetron  (ZOFRAN -ODT) 4 MG disintegrating tablet, Take 1 tablet (4 mg total) by mouth every 8 (eight) hours as needed for nausea or vomiting., Disp: 20 tablet, Rfl: 2  Review of Systems:  Negative unless indicated in HPI.   Physical Exam: Vitals:   12/02/23 1607  BP: 120/80  Pulse: 88  Temp: 98.3 F (36.8 C)  TempSrc: Oral  SpO2: 100%  Weight: 194 lb 4.8 oz (88.1 kg)    Body mass index is 31.36 kg/m.   Physical Exam Vitals reviewed.  Constitutional:      Appearance: Normal appearance.  HENT:     Head: Normocephalic and atraumatic.  Eyes:     Conjunctiva/sclera: Conjunctivae normal.  Neck:     Thyroid : Thyromegaly present. No thyroid  tenderness.  Cardiovascular:     Rate and Rhythm: Normal rate and regular rhythm.  Pulmonary:     Effort: Pulmonary effort is normal.     Breath  sounds: Normal breath sounds.  Skin:    General: Skin is warm and dry.  Neurological:     General: No focal deficit present.     Mental Status: She is alert and oriented to person, place, and time.  Psychiatric:        Mood and Affect: Mood normal.        Behavior: Behavior normal.        Thought Content: Thought content normal.        Judgment: Judgment normal.      Impression and Plan:  Hyperthyroidism -     TSH; Future -     T4, free; Future -     T3, free; Future  Numbness and tingling of both feet -     Vitamin B12; Future   - I suspect her thyroid  function might be off still.  Thyroid  labs ordered, will likely need to be seen by endocrinology again.  Will also check a vitamin  B12 level.  Time spent:23 minutes reviewing chart, interviewing and examining patient and formulating plan of care.     Tully Theophilus Andrews, MD  Primary Care at [facility]

## 2023-12-03 ENCOUNTER — Ambulatory Visit: Payer: Self-pay | Admitting: Internal Medicine

## 2023-12-03 LAB — VITAMIN B12: Vitamin B-12: 524 pg/mL (ref 211–911)

## 2023-12-03 LAB — T3, FREE: T3, Free: 4.7 pg/mL — ABNORMAL HIGH (ref 2.3–4.2)

## 2023-12-03 LAB — T4, FREE: Free T4: 1.41 ng/dL (ref 0.60–1.60)

## 2023-12-03 LAB — TSH: TSH: 0 u[IU]/mL — ABNORMAL LOW (ref 0.35–5.50)

## 2023-12-10 ENCOUNTER — Ambulatory Visit: Admitting: "Endocrinology

## 2023-12-14 ENCOUNTER — Encounter: Payer: Self-pay | Admitting: "Endocrinology

## 2023-12-14 ENCOUNTER — Ambulatory Visit (INDEPENDENT_AMBULATORY_CARE_PROVIDER_SITE_OTHER): Admitting: "Endocrinology

## 2023-12-14 VITALS — BP 140/84 | HR 80 | Ht 66.0 in | Wt 198.0 lb

## 2023-12-14 DIAGNOSIS — E0500 Thyrotoxicosis with diffuse goiter without thyrotoxic crisis or storm: Secondary | ICD-10-CM

## 2023-12-14 DIAGNOSIS — E049 Nontoxic goiter, unspecified: Secondary | ICD-10-CM

## 2023-12-14 MED ORDER — METHIMAZOLE 5 MG PO TABS: 5.0000 mg | ORAL_TABLET | Freq: Every day | ORAL | 1 refills | Status: DC

## 2023-12-14 NOTE — Progress Notes (Signed)
 Outpatient Endocrinology Note Connie Birmingham, MD  12/14/23   Connie Horton 1991-03-21 [id number]  Referring Provider: Theophilus Andrews, Jonna* Primary Care Provider: Theophilus Andrews, Tully GRADE, MD Subjective  No chief complaint on file.   Assessment & Plan  Diagnoses and all orders for this visit:  Graves disease -     methimazole  (TAPAZOLE ) 5 MG tablet; Take 1 tablet (5 mg total) by mouth daily. -     TSH -     T3, free -     T4, free -     US  THYROID ; Future  Goiter   Connie Horton is currently methimazole  2.5 mg every day. Started methimazole  5 mg every day, and stopped due to couldn't swallow and hair fall, stopped taking it around Jan-feb, 2025. After stopping MMI, swallowing and hair fall normalized. Patient was biochemically hyperthyroid on last check.  Discussed the etiology for hyperthyroidism. Educated on thyroid  axis.  Recommend the following: labs today. Complications of untreated hyperthyroidism including atrial fibrillation, heart failure and osteoporosis Side effects of Methimazole  including but not limited to allergic reaction, rash, bone marrow suppression, liver dysfunction and teratogenic potential  I have reviewed current medications, nurse's notes, allergies, vital signs, past medical and surgical history, family medical history, and social history for this encounter. Counseled patient on symptoms, examination findings, lab findings, imaging results, treatment decisions and monitoring and prognosis. The patient understood the recommendations and agrees with the treatment plan. All questions regarding treatment plan were fully answered.   Return in about 2 months (around 02/13/2024) for visit and 8 am labs before next visit.   Connie Birmingham, MD  12/14/23   I have reviewed current medications, nurse's notes, allergies, vital signs, past medical and surgical history, family medical history, and social history for this encounter. Counseled patient  on symptoms, examination findings, lab findings, imaging results, treatment decisions and monitoring and prognosis. The patient understood the recommendations and agrees with the treatment plan. All questions regarding treatment plan were fully answered.   History of Present Illness Connie Horton is a 33 y.o. year old female who presents to our clinic with hyperthyroidism diagnosed in 03/2023 during sore throat.    Started methimazole  5 mg every day, and stopped due to couldn't swallow and hair fall, stopped taking it around Jan-feb, 2025. After stopping MMI, swallowing and hair fall normalized.   Symptoms suggestive of HYPOTHYROIDISM:  fatigue No weight gain Yes cold intolerance  No constipation  No, once, not now  Symptoms suggestive of HYPERTHYROIDISM:  weight loss  No heat intolerance No hyperdefecation  No palpitations  No  Compressive symptoms:  dysphagia  No dysphonia  No positional dyspnea (especially with simultaneous arms elevation)  No  Smokes  No On biotin  No Personal history of head/neck surgery/irradiation  No  Grave's Ophthalmopathy Clinical Activity Score: 0/9  Adverse Drug Effects from Methimazole  (MMI): rash No fever No throat pain No arthritis No mouth ulcers No jaundice No loss of appetite No lymphadenopathy No  Physical Exam  BP (!) 140/84   Pulse 80   Ht 5' 6 (1.676 m)   Wt 198 lb (89.8 kg)   SpO2 95%   BMI 31.96 kg/m  Constitutional: well developed, well nourished Head: normocephalic, atraumatic, no exophthalmos Eyes: sclera anicteric, no redness Neck: + thyromegaly, no thyroid  tenderness; no nodules palpated Lungs: normal respiratory effort Neurology: alert and oriented, no fine hand tremor Skin: dry, no appreciable rashes Musculoskeletal: no appreciable defects Psychiatric: normal mood and affect  Allergies Allergies  Allergen Reactions   Citrus Anaphylaxis and Hives   Latex Anaphylaxis and Itching   Penicillins  Anaphylaxis, Hives, Itching and Shortness Of Breath   Amoxicillin Hives and Itching    Current Medications Patient's Medications  New Prescriptions   No medications on file  Previous Medications   ALBUTEROL  (VENTOLIN  HFA) 108 (90 BASE) MCG/ACT INHALER    Inhale 2 puffs into the lungs every 6 (six) hours as needed for wheezing or shortness of breath.   AMLODIPINE  (NORVASC ) 5 MG TABLET    Take 1 tablet (5 mg total) by mouth daily.   AMLODIPINE  (NORVASC ) 5 MG TABLET    Take 1 tablet (5 mg total) by mouth daily.   DIPHENHYDRAMINE (BENADRYL) 25 MG TABLET    Take 25 mg by mouth every 6 (six) hours as needed for itching or allergies.   IPRATROPIUM-ALBUTEROL  (DUONEB) 0.5-2.5 (3) MG/3ML SOLN    Take 3 mLs by nebulization every 4 (four) hours as needed.   MELOXICAM  (MOBIC ) 15 MG TABLET    Take 1 tablet (15 mg total) by mouth daily.   METRONIDAZOLE  (FLAGYL ) 500 MG TABLET    Take 1 tablet (500 mg total) by mouth 2 (two) times daily.   ONDANSETRON  (ZOFRAN -ODT) 4 MG DISINTEGRATING TABLET    Take 1 tablet (4 mg total) by mouth every 8 (eight) hours as needed for nausea or vomiting.   RESPIRATORY THERAPY SUPPLIES (NEBULIZER/TUBING/MOUTHPIECE) KIT    1 each by Does not apply route every 4 (four) hours as needed.  Modified Medications   Modified Medication Previous Medication   METHIMAZOLE  (TAPAZOLE ) 5 MG TABLET methimazole  (TAPAZOLE ) 5 MG tablet      Take 1 tablet (5 mg total) by mouth daily.    Take 0.5 tablets (2.5 mg total) by mouth daily.  Discontinued Medications   No medications on file    Past Medical History Past Medical History:  Diagnosis Date   Asthma    HTN (hypertension)     Past Surgical History History reviewed. No pertinent surgical history.  Family History family history includes CAD in her father and mother; CVA in her father and mother.  Social History Social History   Socioeconomic History   Marital status: Significant Other    Spouse name: Not on file   Number of  children: Not on file   Years of education: Not on file   Highest education level: 12th grade  Occupational History   Not on file  Tobacco Use   Smoking status: Never   Smokeless tobacco: Never  Vaping Use   Vaping status: Former  Substance and Sexual Activity   Alcohol use: Yes    Comment: occassional   Drug use: Not Currently   Sexual activity: Yes    Birth control/protection: None  Other Topics Concern   Not on file  Social History Narrative   Not on file   Social Drivers of Health   Financial Resource Strain: Medium Risk (06/15/2023)   Overall Financial Resource Strain (CARDIA)    Difficulty of Paying Living Expenses: Somewhat hard  Food Insecurity: Food Insecurity Present (06/15/2023)   Hunger Vital Sign    Worried About Running Out of Food in the Last Year: Sometimes true    Ran Out of Food in the Last Year: Sometimes true  Transportation Needs: No Transportation Needs (06/15/2023)   PRAPARE - Administrator, Civil Service (Medical): No    Lack of Transportation (Non-Medical): No  Physical Activity: Insufficiently Active (06/15/2023)   Exercise  Vital Sign    Days of Exercise per Week: 3 days    Minutes of Exercise per Session: 10 min  Stress: No Stress Concern Present (06/15/2023)   [employer] of Occupational Health - Occupational Stress Questionnaire    Feeling of Stress : Only a little  Recent Concern: Stress - Stress Concern Present (03/17/2023)   [employer] of Occupational Health - Occupational Stress Questionnaire    Feeling of Stress : Very much  Social Connections: Socially Isolated (06/15/2023)   Social Connection and Isolation Panel    Frequency of Communication with Friends and Family: More than three times a week    Frequency of Social Gatherings with Friends and Family: More than three times a week    Attends Religious Services: Never    Database administrator or Organizations: No    Attends Engineer, structural: Not on  file    Marital Status: Never married  Intimate Partner Violence: Unknown (03/23/2022)   Received from [facility]   HITS    Physically Hurt: Not on file    Insult or Talk Down To: Not on file    Threaten Physical Harm: Not on file    Scream or Curse: Not on file    Laboratory Investigations Lab Results  Component Value Date   TSH 0.00 (L) 12/02/2023   TSH <0.01 (L) 08/18/2023   TSH <0.01 (L) 03/17/2023   FREET4 1.41 12/02/2023   FREET4 1.6 08/18/2023   FREET4 1.94 (H) 03/17/2023     Lab Results  Component Value Date   TSI 119 08/18/2023     No components found for: TRAB   Lab Results  Component Value Date   CHOL 169 10/09/2021   Lab Results  Component Value Date   HDL 52.10 10/09/2021   Lab Results  Component Value Date   LDLCALC 102 (H) 10/09/2021   Lab Results  Component Value Date   TRIG 76.0 10/09/2021   Lab Results  Component Value Date   CHOLHDL 3 10/09/2021   Lab Results  Component Value Date   CREATININE 0.74 03/12/2023   Lab Results  Component Value Date   GFR 106.49 03/12/2023      Component Value Date/Time   NA 137 03/12/2023 1629   K 3.8 03/12/2023 1629   CL 107 03/12/2023 1629   CO2 25 03/12/2023 1629   GLUCOSE 95 03/12/2023 1629   BUN 11 03/12/2023 1629   CREATININE 0.74 03/12/2023 1629   CALCIUM 9.1 03/12/2023 1629   PROT 7.5 03/12/2023 1629   ALBUMIN 4.0 03/12/2023 1629   AST 16 03/12/2023 1629   ALT 12 03/12/2023 1629   ALKPHOS 86 03/12/2023 1629   BILITOT 0.3 03/12/2023 1629   GFRNONAA >60 11/11/2021 1050      Latest Ref Rng & Units 03/12/2023    4:29 PM 11/11/2021   10:50 AM 10/09/2021    2:58 PM  BMP  Glucose 70 - 99 mg/dL 95  887  88   BUN 6 - 23 mg/dL 11  12  12    Creatinine 0.40 - 1.20 mg/dL 9.25  9.40  9.35   Sodium 135 - 145 mEq/L 137  139  138   Potassium 3.5 - 5.1 mEq/L 3.8  4.3  4.0   Chloride 96 - 112 mEq/L 107  108  106   CO2 19 - 32 mEq/L 25  25  24    Calcium 8.4 - 10.5 mg/dL 9.1  9.3  9.6  Component Value Date/Time   WBC 9.3 03/12/2023 1629   RBC 4.43 03/12/2023 1629   HGB 12.8 03/12/2023 1629   HCT 38.1 03/12/2023 1629   PLT 353.0 03/12/2023 1629   MCV 85.9 03/12/2023 1629   MCH 27.6 11/11/2021 1050   MCHC 33.7 03/12/2023 1629   RDW 12.3 03/12/2023 1629   LYMPHSABS 3.2 03/12/2023 1629   MONOABS 0.8 03/12/2023 1629   EOSABS 0.2 03/12/2023 1629   BASOSABS 0.1 03/12/2023 1629      Parts of this note may have been dictated using voice recognition software. There may be variances in spelling and vocabulary which are unintentional. Not all errors are proofread. Please notify the dino if any discrepancies are noted or if the meaning of any statement is not clear.

## 2023-12-16 ENCOUNTER — Encounter: Payer: Self-pay | Admitting: Internal Medicine

## 2023-12-17 NOTE — Telephone Encounter (Signed)
 I spoke with the patient and she reported ongoing work related issues and has been experiencing increasing panic and anxiety attacks. Patient requested FMLA forms be completed by the provider as she can not see a therapist soon enough to complete this request. Patient has been scheduled for office visit to discuss

## 2023-12-21 ENCOUNTER — Ambulatory Visit (HOSPITAL_COMMUNITY)
Admission: RE | Admit: 2023-12-21 | Discharge: 2023-12-21 | Disposition: A | Discharge status: Home / Self Care | Source: Ambulatory Visit | Attending: "Endocrinology | Admitting: "Endocrinology

## 2023-12-21 DIAGNOSIS — E0500 Thyrotoxicosis with diffuse goiter without thyrotoxic crisis or storm: Secondary | ICD-10-CM | POA: Insufficient documentation

## 2023-12-22 ENCOUNTER — Ambulatory Visit: Admitting: Internal Medicine

## 2023-12-29 ENCOUNTER — Other Ambulatory Visit: Payer: Self-pay | Admitting: "Endocrinology

## 2023-12-29 DIAGNOSIS — E0500 Thyrotoxicosis with diffuse goiter without thyrotoxic crisis or storm: Secondary | ICD-10-CM

## 2023-12-29 NOTE — Telephone Encounter (Signed)
 Requested Prescriptions   Pending Prescriptions Disp Refills   methimazole  (TAPAZOLE ) 5 MG tablet [Pharmacy Med Name: methIMAzole  5 MG Oral Tablet] 45 tablet 0    Sig: Take 1/2 (one-half) tablet by mouth once daily

## 2023-12-30 ENCOUNTER — Encounter: Payer: Self-pay | Admitting: "Endocrinology

## 2023-12-31 ENCOUNTER — Other Ambulatory Visit: Payer: Self-pay | Admitting: "Endocrinology

## 2023-12-31 DIAGNOSIS — E0500 Thyrotoxicosis with diffuse goiter without thyrotoxic crisis or storm: Secondary | ICD-10-CM

## 2023-12-31 MED ORDER — METHIMAZOLE 5 MG PO TABS: 5.0000 mg | ORAL_TABLET | Freq: Every day | ORAL | 1 refills | Status: AC

## 2024-01-29 ENCOUNTER — Other Ambulatory Visit

## 2024-02-04 ENCOUNTER — Ambulatory Visit: Admitting: "Endocrinology

## 2024-02-11 ENCOUNTER — Telehealth: Admitting: Family Medicine

## 2024-02-11 DIAGNOSIS — K047 Periapical abscess without sinus: Secondary | ICD-10-CM | POA: Diagnosis not present

## 2024-02-11 NOTE — Progress Notes (Signed)
 Pt was seen on evisits and accidentally scheduled this. [facility]

## 2024-02-11 NOTE — Progress Notes (Signed)
 E-Visit for Dental Pain  We are sorry that you are not feeling well.  Here is how we plan to help!  Based on what you have shared with me in the questionnaire, it sounds like you have a dental infection.  I have sent clindamycin.    It is imperative that you see a dentist within 10 days of this eVisit to determine the cause of the dental pain and be sure it is adequately treated  A toothache or tooth pain is caused when the nerve in the root of a tooth or surrounding a tooth is irritated. Dental (tooth) infection, decay, injury, or loss of a tooth are the most common causes of dental pain. Pain may also occur after an extraction (tooth is pulled out). Pain sometimes originates from other areas and radiates to the jaw, thus appearing to be tooth pain.Bacteria growing inside your mouth can contribute to gum disease and dental decay, both of which can cause pain. A toothache occurs from inflammation of the central portion of the tooth called pulp. The pulp contains nerve endings that are very sensitive to pain. Inflammation to the pulp or pulpitis may be caused by dental cavities, trauma, and infection.    HOME CARE:   For toothaches: Over-the-counter pain medications such as acetaminophen or ibuprofen  may be used. Take these as directed on the package while you arrange for a dental appointment. Avoid very cold or hot foods, because they may make the pain worse. You may get relief from biting on a cotton ball soaked in oil of cloves. You can get oil of cloves at most drug stores.  For jaw pain:  Aspirin may be helpful for problems in the joint of the jaw in adults. If pain happens every time you open your mouth widely, the temporomandibular joint (TMJ) may be the source of the pain. Yawning or taking a large bite of food may worsen the pain. An appointment with your doctor or dentist will help you find the cause.     GET HELP RIGHT AWAY IF:  You have a high fever or chills If you have had  a recent head or face injury and develop headache, light headedness, nausea, vomiting, or other symptoms that concern you after an injury to your face or mouth, you could have a more serious injury in addition to your dental injury. A facial rash associated with a toothache: This condition may improve with medication. Contact your doctor for them to decide what is appropriate. Any jaw pain occurring with chest pain: Although jaw pain is most commonly caused by dental disease, it is sometimes referred pain from other areas. People with heart disease, especially people who have had stents placed, people with diabetes, or those who have had heart surgery may have jaw pain as a symptom of heart attack or angina. If your jaw or tooth pain is associated with lightheadedness, sweating, or shortness of breath, you should see a doctor as soon as possible. Trouble swallowing or excessive pain or bleeding from gums: If you have a history of a weakened immune system, diabetes, or steroid use, you may be more susceptible to infections. Infections can often be more severe and extensive or caused by unusual organisms. Dental and gum infections in people with these conditions may require more aggressive treatment. An abscess may need draining or IV antibiotics, for example.  MAKE SURE YOU   Understand these instructions. Will watch your condition. Will get help right away if you are not doing  well or get worse.  Thank you for choosing an e-visit.  Your e-visit answers were reviewed by a board certified advanced clinical practitioner to complete your personal care plan. Depending upon the condition, your plan could have included both over the counter or prescription medications.  Please review your pharmacy choice. Make sure the pharmacy is open so you can pick up prescription now. If there is a problem, you may contact your provider through [employer] and have the prescription routed to another pharmacy.  Your  safety is important to us . If you have drug allergies check your prescription carefully.   For the next 24 hours you can use MyChart to ask questions about today's visit, request a non-urgent call back, or ask for a work or school excuse. You will get an email in the next two days asking about your experience. I hope that your e-visit has been valuable and will speed your recovery.  I have spent 5 minutes in review of e-visit questionnaire, review and updating patient chart, medical decision making and response to patient.   Chantrell Apsey, FNP

## 2024-02-25 ENCOUNTER — Telehealth: Admitting: Physician Assistant

## 2024-02-25 DIAGNOSIS — J069 Acute upper respiratory infection, unspecified: Secondary | ICD-10-CM

## 2024-02-25 MED ORDER — FLUTICASONE PROPIONATE 50 MCG/ACT NA SUSP: 2.0000 | Freq: Every day | NASAL | 0 refills | Status: AC

## 2024-02-25 MED ORDER — LIDOCAINE VISCOUS HCL 2 % MT SOLN: 5.0000 mL | Freq: Four times a day (QID) | OROMUCOSAL | 0 refills | Status: AC | PRN

## 2024-02-25 MED ORDER — PSEUDOEPH-BROMPHEN-DM 30-2-10 MG/5ML PO SYRP: 5.0000 mL | ORAL_SOLUTION | Freq: Four times a day (QID) | ORAL | 0 refills | Status: AC | PRN

## 2024-02-25 NOTE — Progress Notes (Signed)
 We are sorry you are not feeling well.  Here is how we plan to help!  Based on what you have shared with me, it looks like you may have a viral upper respiratory infection.  Upper respiratory infections are caused by a large number of viruses; however, rhinovirus is the most common cause.   Symptoms vary from person to person, with common symptoms including sore throat, cough, and fatigue or lack of energy, and a feeling of general discomfort.  A low-grade fever of up to 100.4 may present, but is often uncommon.  Symptoms vary however, and are closely related to a person's age or underlying illnesses.  The most common symptoms associated with an upper respiratory infection are nasal discharge or congestion, cough, sneezing, headache and pressure in the ears and face.  These symptoms usually persist for about 3 to 10 days, but can last up to 2 weeks.  It is important to know that upper respiratory infections do not cause serious illness or complications in most cases.    Upper respiratory infections can be transmitted from person to person, with the most common method of transmission being a person's hands.  The virus is able to live on the skin and can infect other persons for up to 2 hours after direct contact.  Also, these can be transmitted when someone coughs or sneezes; thus, it is important to cover the mouth to reduce this risk.  To keep the spread of the illness at bay, good hand hygiene is very important!  Because this is a viral infection, there are no specific treatments other than to help you with the symptoms until the infection runs its course.    For nasal congestion, you may use an oral decongestants such as Mucinex D or if you have glaucoma or high blood pressure use plain Mucinex.  Saline nasal spray or nasal drops can help and can safely be used as often as needed for congestion.  For your congestion, I have prescribed Fluticasone  nasal spray one spray in each nostril twice a day  If  you do not have a history of heart disease, hypertension, diabetes or thyroid  disease, prostate/bladder issues or glaucoma, you may also use Sudafed to treat nasal congestion.  It is highly recommended that you consult with a pharmacist or your primary care physician to ensure this medication is safe for you to take.     If you have a cough, you may use over-the-counter cough suppressants such as Delsym and Robitussin.  If you have glaucoma or high blood pressure, you can also use Coricidin HBP.   For cough I have prescribed for you A prescription cough medication called Bromfed-DM 2-30-10 mg/5 mL.  Take 5 mL every 6 hours as needed for cough.  If you have a sore or scratchy throat, use a saltwater gargle-  to  teaspoon of salt dissolved in a 4-ounce to 8-ounce glass of warm water.  Gargle the solution for approximately 15-30 seconds and then spit.  It is important not to swallow the solution.  You can also use throat lozenges/cough drops and Chloraseptic spray to help with throat pain or discomfort.  Warm or cold liquids can also be helpful in relieving throat pain. I have also prescribed I have prescribed a Viscous Lidocaine 2% solution. Swallow 5-10 mL every 4-6 hours as needed for sore throat. DO NOT eat or drink anything for 15-20 minutes after swallowing to allow the medication to coat the throat.  For headache, pain, or  general discomfort, you can use Ibuprofen  or Tylenol as directed.   Some authorities believe that zinc sprays or the use of Echinacea may shorten the course of your symptoms.  For your symptoms of diarrhea you may take Imodium 2 mg tablets that are over the counter at your local pharmacy. Take two tablet now and then one after each loose stool up to 6 a day.    HOME CARE Only take medications as instructed by your medical team. Be sure to drink plenty of fluids. Water is fine as well as fruit juices, sodas and electrolyte beverages. You may want to stay away from caffeine or  alcohol. If you are nauseated, try taking small sips of liquids. How do you know if you are getting enough fluid? Your urine should be a pale yellow or almost colorless. Get rest. Taking a steamy shower or using a humidifier may help nasal congestion and ease sore throat pain. You can place a towel over your head and breathe in the steam from hot water coming from a faucet. Using a saline nasal spray works much the same way. Cough drops, hard candies and sore throat lozenges may ease your cough. Avoid close contacts especially the very young and the elderly Cover your mouth if you cough or sneeze Always remember to wash your hands.   GET HELP RIGHT AWAY IF: You develop worsening fever. If your symptoms do not improve within 10 days You develop yellow or green discharge from your nose over 3 days. You have coughing fits You develop a severe head ache or visual changes. You develop shortness of breath, difficulty breathing or start having chest pain Your symptoms persist after you have completed your treatment plan  MAKE SURE YOU  Understand these instructions. Will watch your condition. Will get help right away if you are not doing well or get worse.  Your e-visit answers were reviewed by a board certified advanced clinical practitioner to complete your personal care plan. Depending upon the condition, your plan could have included both over-the-counter or prescription medications.  Please review your pharmacy choice. If there is a problem, you may call our nursing hot line at and have the prescription routed to another pharmacy. Your safety is important to us . If you have drug allergies, check your prescription carefully.   You can use MyChart to ask questions about today's visit, request a non-urgent call back, or ask for a work or school excuse for 24 hours related to this e-Visit. If it has been greater than 24 hours you will need to follow up with your provider, or enter a new e-Visit  to address those concerns. You will get an e-mail in the next two days asking about your experience.  I hope that your e-visit has been valuable and will speed your recovery. Thank you for using e-visits.   I have spent 5 minutes in review of e-visit questionnaire, review and updating patient chart, medical decision making and response to patient.   Delon CHRISTELLA Dickinson, PA-C

## 2024-05-09 ENCOUNTER — Ambulatory Visit: Admitting: Family Medicine

## 2024-05-09 ENCOUNTER — Ambulatory Visit: Payer: Self-pay

## 2024-05-09 NOTE — Telephone Encounter (Signed)
 Noted

## 2024-05-09 NOTE — Telephone Encounter (Signed)
 FYI Only or Action Required?: FYI only for provider: appointment scheduled on 1/5, [employer] [facility] office.  Patient was last seen in primary care on 02/11/2024 by Blair, Diane W, FNP.  Called Nurse Triage reporting Cough.  Symptoms began 2 weeks ago.  Interventions attempted: OTC medications: cough syrup.  Symptoms are: gradually worsening.  Triage Disposition: See Physician Within 24 Hours  Patient/caregiver understands and will follow disposition?:   Copied from CRM #[id number]. Topic: Clinical - Red Word Triage >> n mucus May 09, 2024  8:19 AM Antony RAMAN wrote: Red Word that prompted transfer to Nurse Triage: severe throat pain inside and out, brown mucus, hurts to swallow Reason for Disposition  Coughing up rusty-colored (reddish-brown) sputum  Answer Assessment - Initial Assessment Questions 1. ONSET: When did the cough begin?      Few weeks 2. SEVERITY: How bad is the cough today?      mild 3. SPUTUM: Describe the color of your sputum (e.g., none, dry cough; clear, white, yellow, green)     brown 4. HEMOPTYSIS: Are you coughing up any blood? If Yes, ask: How much? (e.g., flecks, streaks, tablespoons, etc.)     denies 5. DIFFICULTY BREATHING: Are you having difficulty breathing? If Yes, ask: How bad is it? (e.g., mild, moderate, severe)      denies 6. FEVER: Do you have a fever? If Yes, ask: What is your temperature, how was it measured, and when did it start?     no 7. CARDIAC HISTORY: Do you have any history of heart disease? (e.g., heart attack, congestive heart failure)      no 8. LUNG HISTORY: Do you have any history of lung disease?  (e.g., pulmonary embolus, asthma, emphysema)     no 9. PE RISK FACTORS: Do you have a history of blood clots? (or: recent major surgery, recent prolonged travel, bedridden)     no 10. OTHER SYMPTOMS: Do you have any other symptoms? (e.g., runny nose, wheezing, chest pain)       Sore throat  Protocols used: Cough -  Acute Productive-A-AH

## 2024-05-25 ENCOUNTER — Telehealth: Admitting: Physician Assistant

## 2024-05-25 DIAGNOSIS — Z20822 Contact with and (suspected) exposure to covid-19: Secondary | ICD-10-CM

## 2024-05-25 DIAGNOSIS — R079 Chest pain, unspecified: Secondary | ICD-10-CM

## 2024-05-25 NOTE — Progress Notes (Signed)
" °  Because of severity of symptoms including note of constant pain in chest/abdomen, I feel your condition warrants further evaluation and I recommend that you be seen in a face-to-face visit.   NOTE: There will be NO CHARGE for this E-Visit   If you are having a true medical emergency, please call 911.     For an urgent face to face visit, [place] has multiple urgent care centers for your convenience.  Click the link below for the full list of locations and hours, walk-in wait times, appointment scheduling options and driving directions:  Urgent Care - [place], [place], [place], [place], [place], [place]  [place]     Your MyChart E-visit questionnaire answers were reviewed by a board certified advanced clinical practitioner to complete your personal care plan based on your specific symptoms.    Thank you for using e-Visits.    "

## 2024-05-26 ENCOUNTER — Ambulatory Visit

## 2024-05-26 ENCOUNTER — Ambulatory Visit: Admitting: Adult Health

## 2024-05-26 ENCOUNTER — Ambulatory Visit: Payer: Self-pay | Admitting: Adult Health

## 2024-05-26 ENCOUNTER — Ambulatory Visit: Payer: Self-pay

## 2024-05-26 VITALS — BP 120/80 | Temp 99.0°F | Ht 66.0 in | Wt 190.0 lb

## 2024-05-26 DIAGNOSIS — J988 Other specified respiratory disorders: Secondary | ICD-10-CM

## 2024-05-26 DIAGNOSIS — R051 Acute cough: Secondary | ICD-10-CM | POA: Diagnosis not present

## 2024-05-26 LAB — POC COVID19 BINAXNOW: SARS Coronavirus 2 Ag: NEGATIVE

## 2024-05-26 LAB — POCT INFLUENZA A/B: Influenza A, POC: NEGATIVE

## 2024-05-26 LAB — POCT RAPID STREP A (OFFICE): Rapid Strep A Screen: NEGATIVE

## 2024-05-26 MED ORDER — DOXYCYCLINE HYCLATE 100 MG PO CAPS: 100.0000 mg | ORAL_CAPSULE | Freq: Two times a day (BID) | ORAL | 0 refills | Status: AC

## 2024-05-26 NOTE — Telephone Encounter (Signed)
 FYI Only or Action Required?: FYI only for provider: appointment scheduled on 05/26/24.  Patient was last seen in primary care on 02/11/2024 by Blair, Diane W, FNP.  Called Nurse Triage reporting Cough.  Symptoms began several days ago.  Interventions attempted: Nothing.  Symptoms are: unchanged.  Triage Disposition: See Physician Within 24 Hours  Patient/caregiver understands and will follow disposition?: Yes    Message from Jasmin G sent at 05/26/2024  8:54 AM EST  Reason for Triage: Pt states that she has been experiencing a severe cough to the point that it gives her a headache, diarrhea, vomit, mucus and ear ringing since Sunday. Pt states that she went to the UC since she couldn't schedule an appt at clinic sooner, she was diagnosed with a viral infection and given cough medicine to treat, but symptoms are worsening.   Reason for Disposition  [1] Continuous (nonstop) coughing interferes with work or school AND [2] no improvement using cough treatment per Care Advice  Answer Assessment - Initial Assessment Questions Scheduled 05/26/24 Advised call back or ED/911 if symptoms worsen. Patient verbalized understanding.    1. ONSET: When did the cough begin?      Sunday 2. SEVERITY: How bad is the cough today?      Severe; keeps up at night 3. SPUTUM: Describe the color of your sputum (e.g., none, dry cough; clear, white, yellow, green)     brown 4. HEMOPTYSIS: Are you coughing up any blood? If Yes, ask: How much? (e.g., flecks, streaks, tablespoons, etc.)     no 5. DIFFICULTY BREATHING: Are you having difficulty breathing? If Yes, ask: How bad is it? (e.g., mild, moderate, severe)      No problems 6. FEVER: Do you have a fever? If Yes, ask: What is your temperature, how was it measured, and when did it start?     Fever, chills, n/v Last temp 101, this morning; cough med 24 Hours: vomit, x2, clear Diarrhea, 3x, watery, brown, last bm today Able fluids down;  reports dry mouth, chap lips Urinating as normal 7. CARDIAC HISTORY: Do you have any history of heart disease? (e.g., heart attack, congestive heart failure)      no 8. LUNG HISTORY: Do you have any history of lung disease?  (e.g., pulmonary embolus, asthma, emphysema)     asthma 9. PE RISK FACTORS: Do you have a history of blood clots? (or: recent major surgery, recent prolonged travel, bedridden)     no 10. OTHER SYMPTOMS: Do you have any other symptoms? (e.g., runny nose, wheezing, chest pain) Sore throat, HA Denies diff breath, chest pain, faint, dizziness,  Protocols used: Cough - Acute Productive-A-AH

## 2024-05-26 NOTE — Progress Notes (Signed)
 "  Acute Office Visit  Subjective:     Patient ID: Zena Vitelli, female    DOB: Jul 17, 1990, 34 y.o.   MRN: [id number]  Patient is in today for complaints of cough, headache, sore throat, chills, orthopnea, wheezing, and subjective fever. She states she had symptoms approximately 2 weeks ago that she thought was a sinus infection for which she was seen in urgent care and diagnosed with a dental infection. Patient states she was prescribed clindamycin and prednisone . She did not complete either prescription. Four days ago, she was seen in the urgent care, again for the same symptoms she is being seen for today. She reports that she received a prescription for promethazine  cough syrup and a nasal spray. She did not get those filled. She states she has not taken other medications for her symptoms and feels she is getting worse. 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 Patient states she has been using her nebulizer nightly for the past few nights due to increased shortness of breath and wheezing. She reports that the nebulizer treatments help her breathe better.   Review of Systems  Respiratory:  Positive for cough.   Neurological:  Positive for headaches.      Objective:    BP 120/80   Temp 99 F (37.2 C) (Oral)   Ht 5' 6 (1.676 m)   Wt 86.2 kg   LMP 04/29/2024 (Exact Date)   BMI 30.67 kg/m    Physical Exam Vitals reviewed.  Constitutional:      General: She is not in acute distress.    Appearance: She is well-developed. She is ill-appearing.  HENT:     Head: Normocephalic and atraumatic.     Nose: Congestion and rhinorrhea present.     Mouth/Throat:     Mouth: Mucous membranes are moist.     Pharynx: Oropharynx is clear. Postnasal drip present. No oropharyngeal exudate.     Tonsils: 2+ on the right. 2+ on the left.  Eyes:     Conjunctiva/sclera: Conjunctivae normal.      Pupils: Pupils are equal, round, and reactive to light.  Cardiovascular:     Rate and Rhythm: Normal rate and regular rhythm.     Heart sounds: Normal heart sounds.  Pulmonary:     Breath sounds: Examination of the left-lower field reveals rales. Rales present.     Comments: Fine rales/crackles to left base Abdominal:     General: Bowel sounds are normal.     Palpations: Abdomen is soft.     Tenderness: There is no abdominal tenderness.  Lymphadenopathy:     Cervical: Cervical adenopathy present.  Skin:    General: Skin is warm and moist.     Capillary Refill: Capillary refill takes less than 2 seconds.  Neurological:     Mental Status: She is alert and oriented to person, place, and time.  Psychiatric:        Mood and Affect: Mood normal.        Behavior: Behavior normal.       Assessment & Plan:   1. Respiratory infection (Primary) - likely bacterial CAP - Doxycycline  100 mg by mouth twice daily for 10 days (anaphylaxis to penicillins) - Increase oral fluid intake - Continue using nebulizer nightly and PRN while symptoms persist - POCT rapid strep A - negative - POCT Influenza A/B - negative - POC COVID-19 BinaxNow - negative -  DG Chest 2 View; Future  2. Acute cough - DG Chest 2 View; Future    Debby CHRISTELLA Borer, RN FNP Student  "

## 2024-05-26 NOTE — Telephone Encounter (Signed)
 Noted.
